# Patient Record
Sex: Female | Born: 1995 | Race: Black or African American | Hispanic: No | Marital: Single | Smoking: Never smoker
Health system: Southern US, Community
[De-identification: ages and names within clinical notes are randomized; demographics above are authoritative.]

## PROBLEM LIST (undated history)

## (undated) DIAGNOSIS — A64 Unspecified sexually transmitted disease: Secondary | ICD-10-CM

## (undated) DIAGNOSIS — N946 Dysmenorrhea, unspecified: Secondary | ICD-10-CM

## (undated) DIAGNOSIS — J302 Other seasonal allergic rhinitis: Secondary | ICD-10-CM

## (undated) DIAGNOSIS — Z889 Allergy status to unspecified drugs, medicaments and biological substances status: Secondary | ICD-10-CM

## (undated) HISTORY — DX: Dysmenorrhea, unspecified: N94.6

## (undated) HISTORY — DX: Unspecified sexually transmitted disease: A64

---

## 2016-12-23 ENCOUNTER — Emergency Department (HOSPITAL_BASED_OUTPATIENT_CLINIC_OR_DEPARTMENT_OTHER): Payer: Managed Care, Other (non HMO)

## 2016-12-23 ENCOUNTER — Observation Stay (HOSPITAL_BASED_OUTPATIENT_CLINIC_OR_DEPARTMENT_OTHER)
Admission: EM | Admit: 2016-12-23 | Discharge: 2016-12-24 | Disposition: A | Discharge status: Home / Self Care | Payer: Managed Care, Other (non HMO) | Attending: Internal Medicine | Admitting: Internal Medicine

## 2016-12-23 ENCOUNTER — Encounter (HOSPITAL_BASED_OUTPATIENT_CLINIC_OR_DEPARTMENT_OTHER): Payer: Self-pay

## 2016-12-23 DIAGNOSIS — R531 Weakness: Secondary | ICD-10-CM | POA: Insufficient documentation

## 2016-12-23 DIAGNOSIS — R29810 Facial weakness: Secondary | ICD-10-CM | POA: Insufficient documentation

## 2016-12-23 DIAGNOSIS — Z823 Family history of stroke: Secondary | ICD-10-CM | POA: Insufficient documentation

## 2016-12-23 DIAGNOSIS — R51 Headache: Secondary | ICD-10-CM

## 2016-12-23 DIAGNOSIS — R519 Headache, unspecified: Secondary | ICD-10-CM | POA: Diagnosis present

## 2016-12-23 DIAGNOSIS — R42 Dizziness and giddiness: Secondary | ICD-10-CM | POA: Insufficient documentation

## 2016-12-23 DIAGNOSIS — Z6837 Body mass index (BMI) 37.0-37.9, adult: Secondary | ICD-10-CM | POA: Insufficient documentation

## 2016-12-23 DIAGNOSIS — G8194 Hemiplegia, unspecified affecting left nondominant side: Secondary | ICD-10-CM | POA: Insufficient documentation

## 2016-12-23 DIAGNOSIS — G43109 Migraine with aura, not intractable, without status migrainosus: Secondary | ICD-10-CM | POA: Insufficient documentation

## 2016-12-23 DIAGNOSIS — R4182 Altered mental status, unspecified: Secondary | ICD-10-CM

## 2016-12-23 DIAGNOSIS — E668 Other obesity: Secondary | ICD-10-CM | POA: Insufficient documentation

## 2016-12-23 DIAGNOSIS — Z820 Family history of epilepsy and other diseases of the nervous system: Secondary | ICD-10-CM | POA: Insufficient documentation

## 2016-12-23 DIAGNOSIS — R2990 Unspecified symptoms and signs involving the nervous system: Secondary | ICD-10-CM | POA: Diagnosis present

## 2016-12-23 DIAGNOSIS — Z833 Family history of diabetes mellitus: Secondary | ICD-10-CM | POA: Insufficient documentation

## 2016-12-23 DIAGNOSIS — Z8249 Family history of ischemic heart disease and other diseases of the circulatory system: Secondary | ICD-10-CM | POA: Insufficient documentation

## 2016-12-23 HISTORY — DX: Allergy status to unspecified drugs, medicaments and biological substances: Z88.9

## 2016-12-23 LAB — DRUG SCREEN,URINE - BMC/JMC ONLY
AMPHETAMINES URINE: NEGATIVE
BARBITURATES URINE: NEGATIVE
BENZODIAZEPINES URINE: NEGATIVE
CANNABINOIDS URINE: NEGATIVE
COCAINE METABOLITES URINE: NEGATIVE
METHADONE URINE: NEGATIVE
OPIATES URINE: NEGATIVE
OXYCODONE URINE: NEGATIVE
PCP URINE: NEGATIVE
PCP URINE: NEGATIVE
TRICYCLIC ANTIDEPRESSANTS URINE: NEGATIVE

## 2016-12-23 LAB — LIPASE: LIPASE: 14 U/L — ABNORMAL LOW (ref 22–51)

## 2016-12-23 LAB — COMPREHENSIVE METABOLIC PROFILE - BMC/JMC ONLY
ALBUMIN/GLOBULIN RATIO: 1 (ref 0.8–2.0)
ALBUMIN: 3.7 g/dL (ref 3.5–5.0)
ALKALINE PHOSPHATASE: 59 U/L (ref 38–126)
ALT (SGPT): 37 U/L (ref 14–54)
ANION GAP: 9 mmol/L (ref 3–11)
AST (SGOT): 35 U/L (ref 15–41)
BILIRUBIN TOTAL: 0.7 mg/dL (ref 0.3–1.2)
BUN/CREA RATIO: 6 (ref 6–22)
BUN: 5 mg/dL — ABNORMAL LOW (ref 6–20)
CALCIUM: 9.5 mg/dL (ref 8.6–10.3)
CALCIUM: 9.5 mg/dL (ref 8.6–10.3)
CHLORIDE: 99 mmol/L — ABNORMAL LOW (ref 101–111)
CO2 TOTAL: 29 mmol/L (ref 22–32)
CREATININE: 0.77 mg/dL (ref 0.44–1.00)
ESTIMATED GFR: >60 mL/min/1.73mˆ2 (ref >60)
GLUCOSE: 94 mg/dL (ref 70–110)
POTASSIUM: 3.9 mmol/L (ref 3.4–5.1)
PROTEIN TOTAL: 7.3 g/dL (ref 6.4–8.3)
SODIUM: 137 mmol/L (ref 136–145)

## 2016-12-23 LAB — URINALYSIS WITH MICROSCOPIC REFLEX IF INDICATED BMC/JMC ONLY
BILIRUBIN: NEGATIVE mg/dL
BLOOD: NEGATIVE mg/dL
GLUCOSE: NEGATIVE mg/dL
KETONES: NEGATIVE mg/dL
LEUKOCYTES: NEGATIVE WBCs/uL
NITRITE: NEGATIVE
PH: 6.5 (ref <8.0)
PROTEIN: NEGATIVE mg/dL
SPECIFIC GRAVITY: 1.01 (ref <1.022)
UROBILINOGEN: 0.2 mg/dL (ref <=2.0)

## 2016-12-23 LAB — CBC WITH DIFF
BASOPHIL #: 0 x10ˆ3/uL (ref 0.00–0.10)
BASOPHIL %: 1 % (ref 0–3)
EOSINOPHIL #: 0.3 x10ˆ3/uL (ref 0.00–0.50)
EOSINOPHIL %: 4 % (ref 0–5)
HCT: 43 % (ref 36.0–45.0)
HGB: 13.9 g/dL (ref 12.0–15.5)
LYMPHOCYTE #: 2.2 x10ˆ3/uL (ref 1.00–4.80)
LYMPHOCYTE %: 31 % (ref 15–43)
MCH: 27.8 pg (ref 27.5–33.2)
MCHC: 32.4 g/dL (ref 32.0–36.0)
MCV: 85.9 fL (ref 82.0–97.0)
MONOCYTE #: 1.2 x10ˆ3/uL — ABNORMAL HIGH (ref 0.20–0.90)
MONOCYTE %: 17 % — ABNORMAL HIGH (ref 5–12)
MPV: 7.3 fL — ABNORMAL LOW (ref 7.4–10.5)
NEUTROPHIL #: 3.3 x10ˆ3/uL (ref 1.50–6.50)
NEUTROPHIL %: 48 % (ref 43–76)
PLATELETS: 338 x10ˆ3/uL (ref 150–450)
RBC: 5.01 x10ˆ6/uL (ref 4.00–5.10)
RDW: 14.1 % (ref 11.0–16.0)
WBC: 7 x10ˆ3/uL (ref 4.0–11.0)

## 2016-12-23 LAB — BODY FLUID CELL COUNT
NUCLEATED CELLS, FLUID: 1 /uL (ref 0–5)
RBC COUNT: 6 /uL (ref 0–10)

## 2016-12-23 LAB — GLUCOSE CSF: GLUCOSE CSF: 64 mg/dL (ref 40–70)

## 2016-12-23 LAB — HCG, PLASMA OR SERUM QUANTITATIVE, PREGNANCY: HCG QUANTITATIVE PREGNANCY: <5 IU/L (ref <5)

## 2016-12-23 LAB — PT/INR
INR: 1.08
PROTHROMBIN TIME: 11.9 s (ref 9.4–12.5)

## 2016-12-23 LAB — PTT (PARTIAL THROMBOPLASTIN TIME): APTT: 30.8 s (ref 25.1–36.5)

## 2016-12-23 LAB — TROPONIN-I: TROPONIN I: <0.03 ng/mL (ref <=0.06)

## 2016-12-23 LAB — PROTEIN CSF: PROTEIN CSF: 17 mg/dL (ref 15–45)

## 2016-12-23 LAB — LACTIC ACID LEVEL: LACTIC ACID: 0.6 mmol/L (ref 0.5–2.0)

## 2016-12-23 MED ORDER — SODIUM CHLORIDE 0.9 % IV BOLUS
1000.00 mL | INJECTION | Status: AC
Start: 2016-12-23 — End: 2016-12-23
  Administered 2016-12-23: 1000 mL via INTRAVENOUS

## 2016-12-23 MED ORDER — LIDOCAINE HCL 20 MG/ML (2 %) INJECTION SOLUTION
INTRAMUSCULAR | Status: DC
Start: 2016-12-23 — End: 2016-12-24
  Filled 2016-12-23: qty 10

## 2016-12-23 MED ORDER — MECLIZINE 25 MG TABLET
25.00 mg | ORAL_TABLET | ORAL | Status: AC
Start: 2016-12-23 — End: 2016-12-23
  Filled 2016-12-23: qty 1

## 2016-12-23 MED ORDER — ACETAMINOPHEN 500 MG TABLET
1000.00 mg | ORAL_TABLET | ORAL | Status: AC
Start: 2016-12-23 — End: 2016-12-23
  Administered 2016-12-23: 23:00:00 1000 mg via ORAL
  Filled 2016-12-23: qty 2

## 2016-12-23 MED ADMIN — meclizine 25 mg tablet: ORAL | @ 23:00:00

## 2016-12-23 NOTE — H&P (Signed)
[facility]  General Medicine  Admission H&P    Date of Service:  12/23/2016  [name],[name], 21 y.o. female  Encounter Start Date:  12/23/2016  Inpatient Admission Date:   Date of Birth:  April 04, 1996  PCP: No Pcp    Information Obtained from: patient and mother  Chief Complaint:  Headache and left sided weakness    HPI: [name] is a 21 y.o., Black/African American female who presents on referral from an urgent care center where she presented this evening with headache.  She was noted at the urgent care center to have a left facial droop and some weakness of her left extremities and was referred to emergency department for further evaluation.  Patient states she has had this headache since earlier today.  She denies slurred speech or altered mental status.  Although family who were present with her in the emergency department stated that she was acting differently, with slowing of her mentation.  She has been alert and oriented x3.  She was noted in the emergency department to have a mild left facial droop and weakness of her left upper and lower extremities.  Patient is a nonsmoker, nondrinker and denies any history of illicit drug usage.  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  She denies trauma, chest pain, shortness of breath, difficulty swallowing, visual disturbance, incontinence, abdominal pain, vomiting.  She denies paresthesias.  She does admit to some nausea occurring with the headache today.  She has no history of chronic headaches or migraines.  She also relates a recent change in her birth control method, she was previously on Depo injection and was converted to OCP 4 days ago.  She denies any over-the-counter medications, other prescribed medications, supplements.  ED evaluation included normal laboratory studies and an MRI which was negative for acute pathology.  She did undergo a lumbar puncture which was essentially  unremarkable.  Her left-sided weakness has improved but does persist and she will be admitted for further evaluation and treatment of stroke-like symptoms.      PAST MEDICAL:    History reviewed. No pertinent past medical history.  Past Medical History was reviewed and is negative for Migraines, Heart disease, lung disease, DM, HTN, HLD, Head injury  History reviewed. No past surgical history pertinent negatives.    Medications Prior to Admission     None      No Known Allergies  Vaccinations:        Pneumovax: Does not meet criteria to receive.    Influenza: Not current vaccination season.    Family History   Migraines in her mother , hypertension, stroke, CAD and diabetes    Social History  Social History     Social History   . Marital status: Single     Spouse name: N/A   . Number of children: N/A   . Years of education: N/A     Occupational History   . Not on file.     Social History Main Topics   . Smoking status: Never Smoker   . Smokeless tobacco: Never Used   . Alcohol use Yes      Comment: rarely   . Drug use: No   . Sexual activity: Not on file     Other Topics Concern   . Not on file     Social History Narrative   . No narrative on file       ROS:  Other than ROS in the HPI, all other systems were negative.    Examination:  Temperature: 37.4 C (99.3 F)  Heart Rate: 78  BP (Non-Invasive): 123/63  Respiratory Rate: 19  SpO2-1: 99 %  Pain Score (Numeric, Faces): 10  General: appears in good health, moderately obese, appears stated age, mild distress and vital signs reviewed and discussed with patient  Eyes: Conjunctiva clear., Pupils equal and round, reactive to light and accomodation. , Sclera non-icteric.   HENT:ENMT without erythema or injection, mucous membranes moist.  Neck: no thyromegaly or lymphadenopathy  Lungs: Clear to auscultation bilaterally.   Cardiovascular: regular rate and rhythm, S1, S2 normal, no murmur, click, rub or gallop  Abdomen: Soft, non-tender, Bowel sounds normal, No  hepatosplenomegaly, No masses, No hernias  Genito-urinary: Deferred  Extremities: No cyanosis or edema, No synovitis or joint effusions  Skin: Skin warm and dry, No rashes and No lesions  Neurologic: CN II - XII grossly intact , Alert and oriented x3, No tremor, Flattening of left nasolabial fold, LUE strength 4/5 proximal mm, grip 5/5, LLE 5/5, DTRs 2/4 thru-out.  Mental status, speech and cerebellar functions intact  Lymphatics: No lymphadenopathy  Psychiatric: Normal affect, behavior, memory, thought content, judgement, and speech.    Labs:    I have reviewed all lab results.  Lab Results for Last 24 Hours:    Results for orders placed or performed during the hospital encounter of 12/23/16 (from the past 24 hour(s))   Lactic Acid   Result Value Ref Range    LACTIC ACID 0.6 0.5 - 2.0 mmol/L   PTT (PARTIAL THROMBOPLASTIN TIME)   Result Value Ref Range    APTT 30.8 25.1 - 36.5 seconds   PT/INR   Result Value Ref Range    PROTHROMBIN TIME 11.9 9.4 - 12.5 seconds    INR 1.08    CBC WITH DIFF   Result Value Ref Range    WBC 7.0 4.0 - 11.0 x10^3/uL    RBC 5.01 4.00 - 5.10 x10^6/uL    HGB 13.9 12.0 - 15.5 g/dL    HCT 16.1 09.6 - 04.5 %    MCV 85.9 82.0 - 97.0 fL    MCH 27.8 27.5 - 33.2 pg    MCHC 32.4 32.0 - 36.0 g/dL    RDW 40.9 81.1 - 91.4 %    PLATELETS 338 150 - 450 x10^3/uL    MPV 7.3 (L) 7.4 - 10.5 fL    NEUTROPHIL % 48 43 - 76 %    LYMPHOCYTE % 31 15 - 43 %    MONOCYTE % 17 (H) 5 - 12 %    EOSINOPHIL % 4 0 - 5 %    BASOPHIL % 1 0 - 3 %    NEUTROPHIL # 3.30 1.50 - 6.50 x10^3/uL    LYMPHOCYTE # 2.20 1.00 - 4.80 x10^3/uL    MONOCYTE # 1.20 (H) 0.20 - 0.90 x10^3/uL    EOSINOPHIL # 0.30 0.00 - 0.50 x10^3/uL    BASOPHIL # 0.00 0.00 - 0.10 x10^3/uL   COMPREHENSIVE METABOLIC PROFILE - [facility] ONLY   Result Value Ref Range    SODIUM 137 136 - 145 mmol/L    POTASSIUM 3.9 3.4 - 5.1 mmol/L    CHLORIDE 99 (L) 101 - 111 mmol/L    CO2 TOTAL 29 22 - 32 mmol/L    ANION GAP 9 3 - 11 mmol/L    BUN 5 (L) 6 - 20 mg/dL    CREATININE  7.82  0.44 - 1.00 mg/dL    BUN/CREA RATIO 6 6 - 22    ESTIMATED GFR >60 >60 mL/min/1.15m^2    ALBUMIN 3.7 3.5 - 5.0 g/dL    CALCIUM 9.5 8.6 - 91.4 mg/dL    GLUCOSE 94 70 - 782 mg/dL    ALKALINE PHOSPHATASE 59 38 - 126 U/L    ALT (SGPT) 37 14 - 54 U/L    AST (SGOT) 35 15 - 41 U/L    BILIRUBIN TOTAL 0.7 0.3 - 1.2 mg/dL    PROTEIN TOTAL 7.3 6.4 - 8.3 g/dL    ALBUMIN/GLOBULIN RATIO 1.0 0.8 - 2.0   HCG, PLASMA OR SERUM QUANTITATIVE, PREGNANCY   Result Value Ref Range    HCG QUANTITATIVE PREGNANCY <5 <5 IU/L   Lipase   Result Value Ref Range    LIPASE 14 (L) 22 - 51 U/L   URINALYSIS WITH MICROSCOPIC REFLEX IF INDICATED [facility] ONLY   Result Value Ref Range    COLOR Light Yellow Light Yellow, Straw, Yellow    APPEARANCE Clear Clear    PH 6.5 <8.0    LEUKOCYTES Negative Negative WBCs/uL    NITRITE Negative Negative    PROTEIN Negative Negative mg/dL    GLUCOSE Negative Negative mg/dL    KETONES Negative Negative mg/dL    UROBILINOGEN 0.2  <=9.5 mg/dL    BILIRUBIN Negative Negative mg/dL    BLOOD Negative Negative mg/dL    SPECIFIC GRAVITY 6.213 <1.022   DRUG SCREEN,URINE - [facility] ONLY   Result Value Ref Range    AMPHETAMINES URINE Negative Negative    BARBITURATES URINE Negative Negative    BENZODIAZEPINES URINE Negative Negative    PCP URINE Negative Negative    METHADONE URINE Negative Negative    OPIATES URINE Negative Negative    CANNABINOIDS URINE Negative Negative    COCAINE METABOLITES URINE Negative Negative    OXYCODONE URINE Negative Negative    TRICYCLIC ANTIDEPRESSANTS URINE Negative Negative   CSF CULTURE WITH GRAM STAIN   Result Value Ref Range    GRAM STAIN 1+ Rare WBCs     GRAM STAIN No Organisms Seen    Glucose, CSF   Result Value Ref Range    GLUCOSE CSF 64 40 - 70 mg/dL   Protein, CSF   Result Value Ref Range    PROTEIN CSF 17 15 - 45 mg/dL   BODY FLUID CELL COUNT - CSF   Result Value Ref Range    COLOR Colorless Colorless    CLARITY Clear Clear    NUCLEATED CELLS, FLUID 1 0 - 5 /uL    RBC COUNT 6 0 - 10  /uL    SPECIMEN SITE Tube 4        Imaging Studies:    EXAMINATION: MRI BRAIN WO IV CONTRAST     EXAM DATE/TIME:  12/23/2016 7:37 PM    CLINICAL INDICATION: L sided defecit, HA, dizziness    COMPARISON: None.    FINDINGS:  Extra axial spaces: Normal in size and morphology for the patient's age.  Intracranial hemorrhage: None.  Ventricular system: Normal in size and morphology for the patient's age.  Basal cisterns: Normal.  Cerebral parenchyma: Normal..  Midline shift: None.  Cerebellum: Normal.  Brainstem: Normal.  Calvarium: Normal..  Vascular system: Normal flow voids, without atheroma, occlusion, or  dissection.  Paranasal sinuses and mastoid air cells: Clear.  Visualized Orbits: Normal.  Visualized upper cervical spine: Normal.  Sella: Normal.  Skull base: Normal.  Marrow: Normal.  Impression    Unremarkable MRI of the brain     ECG:  Pending    DNR Status:  Full Code    Assessment/Plan:   Active Hospital Problems   (*Primary Problem)    Diagnosis   . *Stroke-like symptoms   . Headache   . Dizziness       Stroke-like symptoms  --concern for TIA versus hemiplegic migraine versus CNS disease such as MS  --left hemiplegia and facial droop  --these have improved significantly after arrival to the floor but she has some persistent mild flattening of her left nasolabial fold and mild proximal left upper extremity weakness.  --MRI negative  --LP negative, MS banding ordered on LP  --serial neuro exams  --neurology consultation in a.m.  --echocardiogram in a.m.  --consider CT angiogram and further CNS imaging    DVT/PE Prophylaxis: Not indicated, low risk for VTE    [name] [name] Jerry, DO

## 2016-12-23 NOTE — ED Nurses Note (Signed)
Called report to ortho.

## 2016-12-23 NOTE — ED Triage Notes (Signed)
Pt was sent via EMS from [facility] for HA and dizziness.  Per EMS GCS 4-4-6, as she is very slow to respond to questions and at times confused about her answers.

## 2016-12-23 NOTE — ED Provider Notes (Signed)
Bronwen Betters, PA-C  [facility]  [address]  [place], [place] [address]    Emergency Department Visit Note      Date: 12/23/2016  Primary care provider: No Pcp  Means of arrival: ambulance  History obtained by: patient and parent  History limited by: none    Chief Complaint:  Headache    History of Present Illness     [name], date of birth April 26, 1996, is a 21 y.o. female who presents to the Emergency Department complaining of headache. Patient reports HA x 9 hours. Patient reports the front of her head slowly started to hurt during her first class and has slowly gotten worse. Pt reports a few hours PTA she started to feel dizzy and went to urgent care and was sent here. Patient took tylenol for her HA but it did not help. Patient denies trauma to her head. She denies hx of HAs. Denies vomiting. Denies fever or recent illness. Patient denies medical problems and reports the only medication she takes is a new birth control that she started two days ago (unsure of name but confirms it is oral, she was previously on depo). Denies numbness, tingling, surgeries, abdominal pain. Denies history of migraines.     Mom is at bedside and confirms this is not normal for patient. Mom reports patient is normally very quick to answer questions. She denies etoh or hx of drug use and confirms patient started taking OCPs two days ago. Today upon waking, mom reports patient was her normal self but just reported to her that she "didn't feel well" but was still able to go to school.    Review of Systems     The pertinent positive and negative symptoms are as per HPI. All other systems reviewed and are negative.    Patient History      Past Medical History:  History reviewed. No pertinent past medical history.    Past Surgical History:  History reviewed. No pertinent surgical history.    Family History:  Family Medical History     None              Social History:  Social History   Substance Use Topics   . Smoking  status: Never Smoker   . Smokeless tobacco: Never Used   . Alcohol use Yes      Comment: rarely     History   Drug Use No       Medications:  Patient's Medications    No medications on file       Allergies:   No Known Allergies    Physical Exam     Vital Signs:    Filed Vitals:    12/23/16 1809 12/23/16 2045 12/23/16 2230   BP: (!) 142/88 117/80 123/63   Pulse: 81 80 78   Resp: Temp: 37.4 C (99.3 F)     SpO2: 99% 96% 99%       Pulse Ox: 99% on None (Room Air); interpreted by me [id number]    Constitutional: This is an awake and WDWN female patient who is showing no outward signs of distress.    Neuro/Psychiatric: Patient answers questions appropriately but slowly, patient aware of person, place, president but is unaware of date (believes it is March, does not remember her birthday last month), patient is overall appropriate. Weakness to entire L side (LUE/LLE), decreased grip strength on L, no obvious facial droop but with attempted smile, L face does  not elevate, otherwise CN II-XII grossly intact. Slight pronator drift on L, + truncal ataxia  Skin: Warm, dry and good color. No obvious rash noted.  HEENT:   Head: Normocephalic and atraumatic.   Mouth/Throat: Oropharynx is clear and moist.   Ears: EACs show no erythema or exudates and TMs are intact bilaterally with no exudates   Eyes: unable to track finger movements without head movement, eye movements not steady, PERRL, conjunctiva normal in appearance, no gross deformity noted  Neck: Trachea midline. Neck supple. No adenopathy  Cardiovascular: RRR, no obvious murmurs, rubs or gallops appreciated. Intact distal pulses.    Pulmonary/Chest: No respiratory distress. BS clear and equal to auscultation bilaterally.   Abdominal: BS +. Abdomen soft, no tenderness, rebound or guarding. No obvious masses or organomegaly.    Musculoskeletal: No obvious edema or deformity.  Moves spine and extremities freely.     Diagnostics     Labs:    Results for orders  placed or performed during the hospital encounter of 12/23/16   CSF CULTURE WITH GRAM STAIN   Result Value Ref Range    GRAM STAIN 1+ Rare WBCs     GRAM STAIN No Organisms Seen    COMPREHENSIVE METABOLIC PROFILE - [facility] ONLY   Result Value Ref Range    SODIUM 137 136 - 145 mmol/L    POTASSIUM 3.9 3.4 - 5.1 mmol/L    CHLORIDE 99 (L) 101 - 111 mmol/L    CO2 TOTAL 29 22 - 32 mmol/L    ANION GAP 9 3 - 11 mmol/L    BUN 5 (L) 6 - 20 mg/dL    CREATININE 9.60 4.54 - 1.00 mg/dL    BUN/CREA RATIO 6 6 - 22    ESTIMATED GFR >60 >60 mL/min/1.32m^2    ALBUMIN 3.7 3.5 - 5.0 g/dL    CALCIUM 9.5 8.6 - 09.8 mg/dL    GLUCOSE 94 70 - 119 mg/dL    ALKALINE PHOSPHATASE 59 38 - 126 U/L    ALT (SGPT) 37 14 - 54 U/L    AST (SGOT) 35 15 - 41 U/L    BILIRUBIN TOTAL 0.7 0.3 - 1.2 mg/dL    PROTEIN TOTAL 7.3 6.4 - 8.3 g/dL    ALBUMIN/GLOBULIN RATIO 1.0 0.8 - 2.0   URINALYSIS WITH MICROSCOPIC REFLEX IF INDICATED [facility] ONLY   Result Value Ref Range    COLOR Light Yellow Light Yellow, Straw, Yellow    APPEARANCE Clear Clear    PH 6.5 <8.0    LEUKOCYTES Negative Negative WBCs/uL    NITRITE Negative Negative    PROTEIN Negative Negative mg/dL    GLUCOSE Negative Negative mg/dL    KETONES Negative Negative mg/dL    UROBILINOGEN 0.2  <=1.4 mg/dL    BILIRUBIN Negative Negative mg/dL    BLOOD Negative Negative mg/dL    SPECIFIC GRAVITY 7.829 <1.022   HCG, PLASMA OR SERUM QUANTITATIVE, PREGNANCY   Result Value Ref Range    HCG QUANTITATIVE PREGNANCY <5 <5 IU/L   Lipase   Result Value Ref Range    LIPASE 14 (L) 22 - 51 U/L   DRUG SCREEN,URINE - [facility] ONLY   Result Value Ref Range    AMPHETAMINES URINE Negative Negative    BARBITURATES URINE Negative Negative    BENZODIAZEPINES URINE Negative Negative    PCP URINE Negative Negative    METHADONE URINE Negative Negative    OPIATES URINE Negative Negative    CANNABINOIDS URINE Negative Negative    COCAINE  METABOLITES URINE Negative Negative    OXYCODONE URINE Negative Negative    TRICYCLIC  ANTIDEPRESSANTS URINE Negative Negative   Lactic Acid   Result Value Ref Range    LACTIC ACID 0.6 0.5 - 2.0 mmol/L   PTT (PARTIAL THROMBOPLASTIN TIME)   Result Value Ref Range    APTT 30.8 25.1 - 36.5 seconds   PT/INR   Result Value Ref Range    PROTHROMBIN TIME 11.9 9.4 - 12.5 seconds    INR 1.08    CBC WITH DIFF   Result Value Ref Range    WBC 7.0 4.0 - 11.0 x10^3/uL    RBC 5.01 4.00 - 5.10 x10^6/uL    HGB 13.9 12.0 - 15.5 g/dL    HCT 40.9 81.1 - 91.4 %    MCV 85.9 82.0 - 97.0 fL    MCH 27.8 27.5 - 33.2 pg    MCHC 32.4 32.0 - 36.0 g/dL    RDW 78.2 95.6 - 21.3 %    PLATELETS 338 150 - 450 x10^3/uL    MPV 7.3 (L) 7.4 - 10.5 fL    NEUTROPHIL % 48 43 - 76 %    LYMPHOCYTE % 31 15 - 43 %    MONOCYTE % 17 (H) 5 - 12 %    EOSINOPHIL % 4 0 - 5 %    BASOPHIL % 1 0 - 3 %    NEUTROPHIL # 3.30 1.50 - 6.50 x10^3/uL    LYMPHOCYTE # 2.20 1.00 - 4.80 x10^3/uL    MONOCYTE # 1.20 (H) 0.20 - 0.90 x10^3/uL    EOSINOPHIL # 0.30 0.00 - 0.50 x10^3/uL    BASOPHIL # 0.00 0.00 - 0.10 x10^3/uL   Glucose, CSF   Result Value Ref Range    GLUCOSE CSF 64 40 - 70 mg/dL   Protein, CSF   Result Value Ref Range    PROTEIN CSF 17 15 - 45 mg/dL   BODY FLUID CELL COUNT - CSF   Result Value Ref Range    COLOR Colorless Colorless    CLARITY Clear Clear    NUCLEATED CELLS, FLUID 1 0 - 5 /uL    RBC COUNT 6 0 - 10 /uL    SPECIMEN SITE Tube 4      Labs reviewed and interpreted by me.    Radiology:    Ordered this visit:  MRI BRAIN WO IV CONTRAST     Results:  Results for orders placed or performed during the hospital encounter of 12/23/16 (from the past 72 hour(s))   MRI BRAIN WO IV CONTRAST     Status: None    Narrative    RADIOLOGIST: Janne Lab, MD    EXAMINATION: MRI BRAIN WO IV CONTRAST     EXAM DATE/TIME:  12/23/2016 7:37 PM    CLINICAL INDICATION: L sided defecit, HA, dizziness    COMPARISON: None.    FINDINGS:  Extra axial spaces: Normal in size and morphology for the patient's age.  Intracranial hemorrhage: None.  Ventricular system: Normal in size  and morphology for the patient's age.  Basal cisterns: Normal.  Cerebral parenchyma: Normal..  Midline shift: None.  Cerebellum: Normal.  Brainstem: Normal.  Calvarium: Normal..  Vascular system: Normal flow voids, without atheroma, occlusion, or  dissection.  Paranasal sinuses and mastoid air cells: Clear.  Visualized Orbits: Normal.  Visualized upper cervical spine: Normal.  Sella: Normal.  Skull base: Normal.  Marrow: Normal.      Impression    Unremarkable MRI of the  brain         Interpreted by radiologist and independently reviewed by me.    EKG interpretation:  12-Lead EKG reveals, per Dr. Clinton Sawyer-  sinus rhythm, rate of 70, normal axis, regular intervals, no ST segment elevation noted.    ED Progress Note/Medical Decision Making     Old records reviewed by me:   ED Visits- no visits to review  I have reviewed the patient's recent past medical history. Nurse's notes reviewed.  Orders placed during this encounter:  Orders Placed This Encounter   . CSF CULTURE WITH GRAM STAIN   . MRI BRAIN WO IV CONTRAST   . XR CHEST AP PORTABLE   . CBC/DIFF   . COMPREHENSIVE METABOLIC PROFILE - [facility] ONLY   . URINALYSIS WITH MICROSCOPIC REFLEX IF INDICATED [facility] ONLY   . HCG, PLASMA OR SERUM QUANTITATIVE, PREGNANCY   . Lipase   . DRUG SCREEN,URINE - [facility] ONLY   . Lactic Acid   . PTT (PARTIAL THROMBOPLASTIN TIME)   . PT/INR   . CBC WITH DIFF   . Glucose, CSF   . Protein, CSF   . MULTIPLE SCLEROSIS (MS) PROFILE WITH OLIGOCLONAL BANDING   . BODY FLUID CELL COUNT - CSF   . TROPONIN-I   . ECG 12-LEAD   . ECG 12-LEAD   . lidocaine 20 mg/mL (2 %) injection ---Cabinet Override   . NS bolus infusion 1,000 mL   . acetaminophen (TYLENOL) tablet   . meclizine (ANTIVERT) tablet       Patient was initially evaluated by me, possible etiologies for symptoms were discussed with patient and mother the need for further evaluation with the above labs as well as CT scan of head , patient and mother verbalized understanding and was in  agreement with the plan at this time. Discussed case with Dr. Clinton Sawyer, will change CT to MRI- pt and mom in agreement with the plan.     Negative MRI discussed with patient and mother, patient has improved speech with normal pace and mother believes she is improved as well, discussed with them proceeding with lumbar puncture- they are agreeable, consent signed by patient, Dr. Clinton Sawyer at bedside for LP    Lab results discussed with patient and that she would be gotten up to ambulate and I would also be speaking with neurology, she verbalized understanding and had no further questions at this time. She is requesting something for her HA, will order dose of Meclizine, tylenol and NS bolus.     Neurology paged    Discussed case with Susa Loffler, NP who advised patient can be discharged to follow up in the office, no other recommendations received at this time.     Repeat exam shows improvement, patient's speech continues to improve, patient attempted to ambulate but reported double vision and nearly fell, she was attempted to ambulate again and was unable to do so, discussed with Dr. Clinton Sawyer, will phone hospitalist    Dr. Clinton Sawyer spoke with hospitalist, Dr. Bing Quarry, who will admit patient for further management, patient and mother agreeable to plan at this time    Supervising physician: Dr. Thelma Barge Vitals:    12/23/16 1809 12/23/16 2045 12/23/16 2230   BP: (!) 142/88 117/80 123/63   Pulse: 81 80 78   Resp: 20 16 19    Temp: 37.4 C (99.3 F)     SpO2: 99% 96% 99%         Clinical Impression  Encounter Diagnoses   Name Primary?   . 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 Stroke-like symptoms    . Headache    . Dizziness    . Altered mental status, unspecified altered mental status type        Plan/Disposition     Admitted     Condition on Disposition: Stable

## 2016-12-23 NOTE — ED Attending Handoff Note (Signed)
Patient was personally evaluated and examined by myself. I was directive of the care provided to the patient and I agree with the midlevel provider's findings, including the diagnostic interpretations and treatment plans that are documented. I was present for key portions of separately performed procedures if they were necessary. I was also present for time dedicated to interactions inclusive of time noted in any critical care situations. Please see PA's note for details.

## 2016-12-23 NOTE — ED Nurses Note (Signed)
Patient's speech has improved and she is now fluent in her speech and is able to converse appropriately.

## 2016-12-24 ENCOUNTER — Encounter (HOSPITAL_BASED_OUTPATIENT_CLINIC_OR_DEPARTMENT_OTHER): Payer: Self-pay

## 2016-12-24 ENCOUNTER — Observation Stay (HOSPITAL_BASED_OUTPATIENT_CLINIC_OR_DEPARTMENT_OTHER): Payer: Managed Care, Other (non HMO)

## 2016-12-24 DIAGNOSIS — G43909 Migraine, unspecified, not intractable, without status migrainosus: Secondary | ICD-10-CM

## 2016-12-24 DIAGNOSIS — Z820 Family history of epilepsy and other diseases of the nervous system: Secondary | ICD-10-CM

## 2016-12-24 LAB — THYROID STIMULATING HORMONE (SENSITIVE TSH): TSH: 1.19 u[IU]/mL (ref 0.340–5.330)

## 2016-12-24 LAB — LIPID PANEL
CHOL/HDL RATIO: 2.4
CHOLESTEROL: 142 mg/dL (ref 120–199)
HDL CHOL: 60 mg/dL (ref 45–65)
LDL CALC: 68 mg/dL (ref <=130)
TRIGLYCERIDES: 71 mg/dL (ref 35–135)
VLDL CALC: 14 mg/dL (ref 5–35)

## 2016-12-24 LAB — C-REACTIVE PROTEIN(CRP),INFLAMMATION: CRP INFLAMMATION: 13.9 mg/L — ABNORMAL HIGH (ref <=10.0)

## 2016-12-24 LAB — VITAMIN B12: VITAMIN B 12: 294 pg/mL (ref 180–914)

## 2016-12-24 LAB — HGA1C (HEMOGLOBIN A1C WITH EST AVG GLUCOSE)
ESTIMATED AVERAGE GLUCOSE: 111 mg/dL — ABNORMAL HIGH (ref 70–110)
GLYCOHEMOGLOBIN (HBA1C): 5.5 % (ref 4.0–6.0)

## 2016-12-24 LAB — SYPHILIS SCREENING ALGORITHM WITH REFLEX, SERUM: SYPHILIS TP ANTIBODIES QUALITATIVE: NONREACTIVE

## 2016-12-24 LAB — SEDIMENTATION RATE: ERYTHROCYTE SEDIMENTATION RATE (ESR): 19 mm/h (ref 0–20)

## 2016-12-24 LAB — FOLATE: FOLATE: 16.2 ng/mL (ref >=4.5)

## 2016-12-24 MED ORDER — SODIUM CHLORIDE 0.9 % (FLUSH) INJECTION SYRINGE
10.0000 mL | INJECTION | INTRAMUSCULAR | Status: DC | PRN
Start: 2016-12-24 — End: 2016-12-24

## 2016-12-24 MED ORDER — POTASSIUM CHLORIDE 20 MEQ/L IN 0.9 % SODIUM CHLORIDE INTRAVENOUS
INTRAVENOUS | Status: DC
Start: 2016-12-24 — End: 2016-12-24
  Filled 2016-12-24: qty 1000

## 2016-12-24 MED ORDER — ASPIRIN 325 MG TABLET,DELAYED RELEASE
325.0000 mg | DELAYED_RELEASE_TABLET | Freq: Every day | ORAL | Status: DC
Start: 2016-12-24 — End: 2016-12-24
  Administered 2016-12-24: 325 mg via ORAL
  Filled 2016-12-24: qty 1

## 2016-12-24 MED ORDER — SODIUM CHLORIDE 0.9 % (FLUSH) INJECTION SYRINGE
10.0000 mL | INJECTION | Freq: Three times a day (TID) | INTRAMUSCULAR | Status: DC
Start: 2016-12-24 — End: 2016-12-24
  Administered 2016-12-24: 10 mL via INTRAVENOUS

## 2016-12-24 MED ADMIN — potassium chloride 20 mEq/L in 0.9 % sodium chloride intravenous: INTRAVENOUS | @ 01:00:00

## 2016-12-24 MED ADMIN — aspirin 325 mg tablet,delayed release: ORAL | @ 09:00:00

## 2016-12-24 MED ADMIN — sodium chloride 0.9 % (flush) injection syringe: INTRAVENOUS | @ 01:00:00

## 2016-12-24 NOTE — Care Plan (Signed)
Problem: Patient Care Overview (Adult,OB)  Goal: Plan of Care Review(Adult,OB)  The patient and/or their representative will communicate an understanding of their plan of care   Outcome: Ongoing (see interventions/notes)      Problem: Stroke (Ischemic) (Adult)  Prevent and manage potential problems including:  1. acute neurologic deterioration  2. bladder/bowel dysfunction  3. cognitive impairment  4. communication impairment  5. eating/swallowing impairment  6. fever  7. hemodynamic instability  8. motor/sensory impairment  9. muscle tone abnormal  10. respiratory compromise  11. situational response  12. skin breakdown  13. venous thromboembolism   Goal: Signs and Symptoms of Listed Potential Problems Will be Absent, Minimized or Managed (Stroke)  Signs and symptoms of listed potential problems will be absent, minimized or managed by discharge/transition of care (reference Stroke (Ischemic) (Adult) CPG).   Outcome: Ongoing (see interventions/notes)      Problem: Fall Risk (Adult)  Goal: Absence of Falls  Patient will demonstrate the desired outcomes by discharge/transition of care.   Outcome: Ongoing (see interventions/notes)      Comments: Care Plan Note     Patient here for stroke like symptoms but is dx with migraine. Neuro checks show left side moderate and right side strong. Memory/vision/swallowing-no issues. High fall risk-little wobbly when up. Denies any pain at this time. Appetite fair. Patient works at [employer] and goes to school.Voiding well. Last BM was 4/10. Seen by L.Journalist, newspaper.

## 2016-12-24 NOTE — Nurses Notes (Signed)
Pt admitted to room 434A via stretcher from ED. Pt ambulated to bed with 2 assist. Unit orientation packet, stroke pathway, and individual stroke education provided and discussed with patient and patient's mother. Tele # [phone] placed on pt. No questions at this time. Call bell within reach.

## 2016-12-24 NOTE — Nurses Notes (Signed)
Pt states her PCP is Dr. Norma Fredrickson at [facility].

## 2016-12-24 NOTE — Nurses Notes (Signed)
Report received from E.Carpenter RN.

## 2016-12-24 NOTE — Consults (Signed)
[facility] MEDICINE  [facility]  [place], [place] [address]    Neurology Initial Consult      [name] is a 21 y.o. female  Date of Admission: 12/23/2016    Date of service: 12/24/16  Date of Birth:  31-May-1996    Consult Requested By: Dr. Bing Quarry    Information obtained from: patient, health care provider and history reviewed via medical record  Chief Complaint:  Headache, L side weakness    [name] is a 21 y.o. female, who presents with hx of migraine as a teen; never treated other then OTC; mother has migraine headaches; she did have MHA and photophobia, phonophobia as a teen but never had any problems with it for many years; she presented with L side headache yesterday, and given the L side weakness, she was kept for obs and stroke w/u; she has no RF of stroke and her MRI Brain was normal; this morning she is feeling better, very mild headache; she is a Conservation officer, nature at night; sleeps during the day (?nightshift sleep d/o) and is otherwise healthy; she just started BCP 3d ago for the first time; she does have seasonal allergies (which may also be a trigger)    Past Medical History:   Diagnosis Date   . Hx of seasonal allergies            Past Surgical History was reviewed and is negative for surgery        No Known Allergies    (Not in an outpatient encounter)  Current Facility-Administered Medications   Medication Dose Route Frequency Provider Last Rate Last Dose   . aspirin (ECOTRIN) enteric coated tablet 325 mg  325 mg Oral Daily Debord, Karolee Ohs, DO   325 mg at 12/24/16 0843   . lidocaine 20 mg/mL (2 %) injection ---[employer]            . NS 1000 mL with potassium chloride 20 mEq premix infusion   Intravenous Continuous Debord, Karolee Ohs, DO 75 mL/hr at 12/24/16 0041     . NS flush syringe  10 mL Intravenous Q8HRS Debord, Karolee Ohs, DO   Stopped at 12/24/16 0600   . NS flush syringe  10 mL Intravenous Q1H PRN Debord, Karolee Ohs, DO           Social History     Social History    . Marital status: Single     Spouse name: N/A   . Number of children: N/A   . Years of education: N/A     Occupational History   . Not on file.     Social History Main Topics   . Smoking status: Never Smoker   . Smokeless tobacco: Never Used   . Alcohol use Yes      Comment: rarely   . Drug use: No   . Sexual activity: Not on file     Other Topics Concern   . Not on file     Social History Narrative       Family Medical History     None              ROS: Other than ROS in the HPI, all other systems were negative.    Exam:   BP 128/68  Pulse 99  Temp 37.6 C (99.7 F)  Resp 16  Ht 1.651 m ( )  Wt 102.1 kg (225 lb)  LMP 12/07/2016  SpO2 96%  BMI 37.44 kg/m2  Telemetry: NSR  General: No apparent distress, healthy appearance.  Neck: supple, no carotid bruits.  Cardiovascular: regular rate and rhythm  Lungs: Clear to auscultation  Extremities: no obvious circulatory problems or trauma  Ophthalomscopic: -  Mental status:  Level of Consciousness: alert  Orientations: Alert and oriented x 3  Memory: Registration, Recall, and Following of commands is normal  Attention: Attention and Concentration are normal  Knowledge: Good  Language: Normal  Speech: Normal   Cranial nerves:   CN2: Visual acuity and fields intact  CN 3,4,6: EOMI, PERRLA  CN 5Facial sensation intact  CN 7Face symmetrical  CN 8: Hearing grossly intact  CN 9,10: Palate symmetric and gag normal  CN 11: Sternocleidomastoid and Trapezius have normal strength.   CN 12: Tongue normal with no fasiculations or deviation  Gait, Coordination, and Reflexes:   Gait: Normal  Coordination: Coordination is normal without tremor  Reflexes: Reflexes are 2/2 throughout  Motor:   Muscle tone:  Upper and lower muscle tone is normal  Motor strength:  Motor strength is normal throughout.  Sensory: Sensory exam in the upper and lower extremities is normal    Misc: no TMJ or ON tenderness    Labs:   Admission on 12/23/2016   Component Date Value Ref Range Status   .  SODIUM 12/23/2016 137  136 - 145 mmol/L Final   . POTASSIUM 12/23/2016 3.9  3.4 - 5.1 mmol/L Final   . CHLORIDE 12/23/2016 99* 101 - 111 mmol/L Final   . CO2 TOTAL 12/23/2016 29  22 - 32 mmol/L Final   . ANION GAP 12/23/2016 9  3 - 11 mmol/L Final   . BUN 12/23/2016 5* 6 - 20 mg/dL Final   . CREATININE 29/56/2130 0.77  0.44 - 1.00 mg/dL Final   . BUN/CREA RATIO 12/23/2016 6  6 - 22 Final   . ESTIMATED GFR 12/23/2016 >60  >60 mL/min/1.48m^2 Final   . ALBUMIN 12/23/2016 3.7  3.5 - 5.0 g/dL Final   . CALCIUM 86/57/8469 9.5  8.6 - 10.3 mg/dL Final   . GLUCOSE 62/95/2841 94  70 - 110 mg/dL Final   . ALKALINE PHOSPHATASE 12/23/2016 59  38 - 126 U/L Final    Prelim result verified   . ALT (SGPT) 12/23/2016 37  14 - 54 U/L Final    Prelim result verified   . AST (SGOT) 12/23/2016 35  15 - 41 U/L Final    Prelim result verified   . BILIRUBIN TOTAL 12/23/2016 0.7  0.3 - 1.2 mg/dL Final    Prelim result verified   . PROTEIN TOTAL 12/23/2016 7.3  6.4 - 8.3 g/dL Final   . ALBUMIN/GLOBULIN RATIO 12/23/2016 1.0  0.8 - 2.0 Final   . COLOR 12/23/2016 Light Yellow  Light Yellow, Straw, Yellow Final   . APPEARANCE 12/23/2016 Clear  Clear Final   . PH 12/23/2016 6.5  <8.0 Final   . LEUKOCYTES 12/23/2016 Negative  Negative WBCs/uL Final   . NITRITE 12/23/2016 Negative  Negative Final   . PROTEIN 12/23/2016 Negative  Negative mg/dL Final   . GLUCOSE 32/44/0102 Negative  Negative mg/dL Final   . KETONES 72/53/6644 Negative  Negative mg/dL Final   . UROBILINOGEN 12/23/2016 0.2   <=2.0 mg/dL Final   . BILIRUBIN 03/47/4259 Negative  Negative mg/dL Final   . BLOOD 56/38/7564 Negative  Negative mg/dL Final   . SPECIFIC GRAVITY 12/23/2016 1.010  <1.022 Final   . HCG QUANTITATIVE PREGNANCY 12/23/2016 <5  <5 IU/L Final   . LIPASE  12/23/2016 14* 22 - 51 U/L Final   . AMPHETAMINES URINE 12/23/2016 Negative  Negative Final      Cutoff of 1000 ng/mL   . BARBITURATES URINE 12/23/2016 Negative  Negative Final      Cutoff of 200 ng/mL   .  BENZODIAZEPINES URINE 12/23/2016 Negative  Negative Final      Cutoff of 200 ng/mL   . PCP URINE 12/23/2016 Negative  Negative Final      Cutoff of 25 ng/mL   . METHADONE URINE 12/23/2016 Negative  Negative Final      Cutoff of 300 ng/mL   . OPIATES URINE 12/23/2016 Negative  Negative Final      Cutoff of 300 ng/mL     . CANNABINOIDS URINE 12/23/2016 Negative  Negative Final      Cutoff of 50 ng/mL   . COCAINE METABOLITES URINE 12/23/2016 Negative  Negative Final      Cutoff of 300 ng/mL   . OXYCODONE URINE 12/23/2016 Negative  Negative Final      Cutoff of 300 ng/mL   . TRICYCLIC ANTIDEPRESSANTS URINE 12/23/2016 Negative  Negative Final      Cutoff of 300 ng/mL   . LACTIC ACID 12/23/2016 0.6  0.5 - 2.0 mmol/L Final   . APTT 12/23/2016 30.8  25.1 - 36.5 seconds Final   . PROTHROMBIN TIME 12/23/2016 11.9  9.4 - 12.5 seconds Final   . INR 12/23/2016 1.08   Final   . WBC 12/23/2016 7.0  4.0 - 11.0 x10^3/uL Final   . RBC 12/23/2016 5.01  4.00 - 5.10 x10^6/uL Final   . HGB 12/23/2016 13.9  12.0 - 15.5 g/dL Final   . HCT 13/04/6577 43.0  36.0 - 45.0 % Final   . MCV 12/23/2016 85.9  82.0 - 97.0 fL Final   . MCH 12/23/2016 27.8  27.5 - 33.2 pg Final   . MCHC 12/23/2016 32.4  32.0 - 36.0 g/dL Final   . RDW 46/96/2952 14.1  11.0 - 16.0 % Final   . PLATELETS 12/23/2016 338  150 - 450 x10^3/uL Final   . MPV 12/23/2016 7.3* 7.4 - 10.5 fL Final   . NEUTROPHIL % 12/23/2016 48  43 - 76 % Final   . LYMPHOCYTE % 12/23/2016 31  15 - 43 % Final   . MONOCYTE % 12/23/2016 17* 5 - 12 % Final   . EOSINOPHIL % 12/23/2016 4  0 - 5 % Final   . BASOPHIL % 12/23/2016 1  0 - 3 % Final   . NEUTROPHIL # 12/23/2016 3.30  1.50 - 6.50 x10^3/uL Final   . LYMPHOCYTE # 12/23/2016 2.20  1.00 - 4.80 x10^3/uL Final   . MONOCYTE # 12/23/2016 1.20* 0.20 - 0.90 x10^3/uL Final   . EOSINOPHIL # 12/23/2016 0.30  0.00 - 0.50 x10^3/uL Final   . BASOPHIL # 12/23/2016 0.00  0.00 - 0.10 x10^3/uL Final   . Ventricular rate 12/23/2016 70  BPM Incomplete   . Atrial Rate  12/23/2016 70  BPM Incomplete   . PR Interval 12/23/2016 142  ms Incomplete   . QRS Duration 12/23/2016 82  ms Incomplete   . QT Interval 12/23/2016 366  ms Incomplete   . QTC Calculation 12/23/2016 395  ms Incomplete   . Calculated P Axis 12/23/2016 58  degrees Incomplete   . Calculated R Axis 12/23/2016 57  degrees Incomplete   . Calculated T Axis 12/23/2016 45  degrees Incomplete   . GRAM STAIN 12/23/2016 1+ Rare WBCs  Preliminary   . GRAM STAIN 12/23/2016 No Organisms Seen   Preliminary   . GLUCOSE CSF 12/23/2016 64  40 - 70 mg/dL Final   . PROTEIN CSF 16/06/9603 17  15 - 45 mg/dL Final   . COLOR 54/05/8118 Colorless  Colorless Final   . CLARITY 12/23/2016 Clear  Clear Final   . NUCLEATED CELLS, FLUID 12/23/2016 1  0 - 5 /uL Final   . RBC COUNT 12/23/2016 6  0 - 10 /uL Final   . SPECIMEN SITE 12/23/2016 Tube 4   Final   . TROPONIN I 12/23/2016 <0.03  <=0.06 ng/mL Final    Troponin values of 0.07 - 0.50 ng/mL may represent cardiac  damage or increased risk of coronary event. Non-acute  coronary syndrome conditions, including tachycardia,  coronary vasospasm, congestive heart failure, myocarditis,  pulmonary embolus, sepsis, and cardiac surgery could result  in myocardial damage and increased troponin levels. Use  troponin in conjunction with history, appropriate symptoms,  and/or ECG changes. Serial measurements may be needed to  assess the possibility of myocardial infarction.  A Troponin value >0.50 ng/mL is strongly suggestive of acute  myocardial infarction (AMI).   . Ventricular rate 12/24/2016 53  BPM Incomplete   . Atrial Rate 12/24/2016 53  BPM Incomplete   . PR Interval 12/24/2016 158  ms Incomplete   . QRS Duration 12/24/2016 80  ms Incomplete   . QT Interval 12/24/2016 406  ms Incomplete   . QTC Calculation 12/24/2016 380  ms Incomplete   . Calculated P Axis 12/24/2016 40  degrees Incomplete   . Calculated R Axis 12/24/2016 28  degrees Incomplete   . Calculated T Axis 12/24/2016 46  degrees Incomplete    . CHOLESTEROL 12/24/2016 142  120 - 199 mg/dL Final   . HDL CHOL 14/78/2956 60  45 - 65 mg/dL Final   . TRIGLYCERIDES 12/24/2016 71  35 - 135 mg/dL Final   . LDL CALC 21/30/8657 68  <=130 mg/dL Final   . VLDL CALC 84/69/6295 14  5 - 35 mg/dL Final   . CHOL/HDL RATIO 12/24/2016 2.4   Final    Risk Factor for Coronary Artery Disease (CAD) is as follows:                             FEMALE          FEMALE  1/2 Risk of CAD          3.4            3.3  Normal Risk of CAD       5.0            4.4  2x Risk of CAD           9.6            7.0  3x Risk of CAD          23.4           11.0   . FOLATE 12/23/2016 16.2  >=4.5 ng/mL Final   . VITAMIN B 12 12/23/2016 294  180 - 914 pg/mL Final   . TSH 12/23/2016 1.190  0.340 - 5.330 uIU/mL Final   . SYPHILIS TP ANTIBODIES QUALITATIVE 12/23/2016 Non-reactive  Non-reactive Final      No serologic evidence of exposure to syphilis.   Marland Kitchen ERYTHROCYTE SEDIMENTATION RATE (ES* 12/24/2016 19  0 - 20 mm/hr Final   . CRP  INFLAMMATION 12/23/2016 13.9* <=10.0 mg/L Final       Review of reports and notes reveal:   Notes, labs, notes, labs    Review of images:: MRI Brain: normal    Impressions/Recommendations:  Principal Problem:    Stroke-like symptoms  Active Problems:    Headache    Dizziness    HEMICRANIAL MIGRAINE L  Strong FMH MIGRAINE  Hx MHA as a teen  HX OF ANATOMICALLY FUSED L ARM  -normal nonfocal neuro exam  -continue present medications, if headache returns, we may have to place her on a preventative  -will use mymigrainebuddy app to track her headaches  -discussed migraines, BCP and possibly this has triggered her again  [name] Boyer, APRN  12/24/2016, 10:44

## 2016-12-24 NOTE — Care Management Notes (Signed)
12/24/16 1027   Assessment Detail   Assessment Type Admission   Date of Care Management Update 12/24/16   Social Work Plan   Discharge Planning Status initial meeting   Anticipated Discharge Disposition Home   CM will evaluate for rehabilitation potential yes   Plan Home     Patient lives with her parents. She attends [employer], majoring in occupational therapy. She works nights at [employer]. She has her own transportation and does not have any issues getting her medications.  Patient sees a PCP in [place].  She denies any needs for discharge at this time.

## 2016-12-24 NOTE — Care Plan (Signed)
Problem: Patient Care Overview (Adult,OB)  Goal: Plan of Care Review(Adult,OB)  The patient and/or their representative will communicate an understanding of their plan of care   Outcome: Ongoing (see interventions/notes)      Problem: Stroke (Ischemic) (Adult)  Prevent and manage potential problems including:  1. acute neurologic deterioration  2. bladder/bowel dysfunction  3. cognitive impairment  4. communication impairment  5. eating/swallowing impairment  6. fever  7. hemodynamic instability  8. motor/sensory impairment  9. muscle tone abnormal  10. respiratory compromise  11. situational response  12. skin breakdown  13. venous thromboembolism   Goal: Signs and Symptoms of Listed Potential Problems Will be Absent, Minimized or Managed (Stroke)  Signs and symptoms of listed potential problems will be absent, minimized or managed by discharge/transition of care (reference Stroke (Ischemic) (Adult) CPG).   Outcome: Ongoing (see interventions/notes)      Problem: Fall Risk (Adult)  Goal: Identify Related Risk Factors and Signs and Symptoms  Related risk factors and signs and symptoms are identified upon initiation of Human Response Clinical Practice Guideline (CPG).   Outcome: Ongoing (see interventions/notes)      Comments: Care Plan Note    Situation: Stroke-like symptoms    Intervention: Neurological checks completed Q2, TELE #35, SCDs    Response: Pt stable this shift. A&O x 4.  Speech is clear. Pt with L facial droop. Pupils are 2mm, PERRLA, brisk. Reports HA is "almost gone." Denies numbness/tingling to BUE and BLE. Palpable radial and pedal pulses 2+. Cap refill <3 seconds. R grip and dorsi/plantar is strong. L grip is weak and L dorsi/plantar flexion moderate. Gait very unsteady, needs 2 assist to ambulate. Pt aware to call for help getting OOB. Call bell within reach.         Achilles Dunk, RN

## 2016-12-24 NOTE — Discharge Summary (Signed)
[facility]  [place], [place] [address]    DISCHARGE SUMMARY  Conley Canal, FNP-C      PATIENT NAMEAvrielle, [name]  MRN:  [id number]  DOB:  February 13, 1996    ADMISSION DATE:  12/23/2016  DISCHARGE DATE:  12/24/2016    ATTENDING PHYSICIAN: Conley Canal, FNP-C  PRIMARY CARE PHYSICIAN: No Pcp     ADMISSION DIAGNOSIS: Stroke-like symptoms  DISCHARGE DIAGNOSIS: Migraine aura  Active Hospital Problems    Diagnosis Date Noted    Principle Problem: Stroke-like symptoms 12/23/2016    Headache 12/23/2016    Dizziness 12/23/2016      Resolved Hospital Problems    Diagnosis    No resolved problems to display.     There are no active non-hospital problems to display for this patient.     DISCHARGE MEDICATIONS:     Current Discharge Medication List      Notice     You have not been prescribed any medications.        DISCHARGE INSTRUCTIONS:     DISCHARGE INSTRUCTION - DIET   Diet: RESUME HOME DIET      DISCHARGE INSTRUCTION - ACTIVITY   Activity: AS TOLERATED      ASPIRIN NOT ORDERED AT THIS TIME       REASON FOR HOSPITALIZATION:  This is a 21 y.o., female admitted with stroke-like symptoms.    SIGNIFICANT PHYSICAL FINDINGS:   Completley symptom free since arrival to floor.    SIGNIFICANT LAB:     Results for NAHLA, LUKIN (MRN [id number]) as of 12/24/2016 13:58   Ref. Range 12/24/2016 05:59   CHOLESTEROL Latest Ref Range: 120 - 199 mg/dL 956   HDL-CHOLESTEROL Latest Ref Range: 45 - 65 mg/dL 60   LDL (CALCULATED) Latest Ref Range: <=130 mg/dL 68   TRIGLYCERIDES Latest Ref Range: 35 - 135 mg/dL 71   VLDL (CALCULATED) Latest Ref Range: 5 - 35 mg/dL 14   CHOL/HDL RATIO Unknown 2.4       Results for CHARMEL, PRONOVOST (MRN [id number]) as of 12/24/2016 13:58   Ref. Range 12/23/2016 18:41 12/23/2016 18:44   WBC Latest Ref Range: 4.0 - 11.0 x103/uL 7.0    HGB Latest Ref Range: 12.0 - 15.5 g/dL 84.6    HCT Latest Ref Range: 36.0 - 45.0 % 43.0    PLATELET COUNT Latest Ref Range: 150 - 450 x103/uL 338    RBC Latest Ref  Range: 4.00 - 5.10 x106/uL 5.01    MCV Latest Ref Range: 82.0 - 97.0 fL 85.9    MCHC Latest Ref Range: 32.0 - 36.0 g/dL 96.2    MCH Latest Ref Range: 27.5 - 33.2 pg 27.8    RDW Latest Ref Range: 11.0 - 16.0 % 14.1    MPV Latest Ref Range: 7.4 - 10.5 fL 7.3 (L)    PMN'S Latest Ref Range: 43 - 76 % 48    LYMPHOCYTES Latest Ref Range: 15 - 43 % 31    EOSINOPHIL Latest Ref Range: 0 - 5 % 4    MONOCYTES Latest Ref Range: 5 - 12 % 17 (H)    BASOPHILS Latest Ref Range: 0 - 3 % 1    PMN ABS Latest Ref Range: 1.50 - 6.50 x103/uL 3.30    LYMPHS ABS Latest Ref Range: 1.00 - 4.80 x103/uL 2.20    EOS ABS Latest Ref Range: 0.00 - 0.50 x103/uL 0.30    MONOS ABS Latest Ref Range: 0.20 - 0.90 x103/uL 1.20 (H)  BASOS ABS Latest Ref Range: 0.00 - 0.10 x103/uL 0.00    PROTHROMBIN TIME Latest Ref Range: 9.4 - 12.5 seconds 11.9    INR Unknown 1.08    aPTT Latest Ref Range: 25.1 - 36.5 seconds 30.8    SODIUM Latest Ref Range: 136 - 145 mmol/L  137   POTASSIUM Latest Ref Range: 3.4 - 5.1 mmol/L  3.9   CHLORIDE Latest Ref Range: 101 - 111 mmol/L  99 (L)   CARBON DIOXIDE Latest Ref Range: 22 - 32 mmol/L  29   BUN Latest Ref Range: 6 - 20 mg/dL  5 (L)   CREATININE Latest Ref Range: 0.44 - 1.00 mg/dL  3.47   GLUCOSE Latest Ref Range: 70 - 110 mg/dL  94   ANION GAP Latest Ref Range: 3 - 11 mmol/L  9   BUN/CREAT RATIO Latest Ref Range: 6 - 22   6   ESTIMATED GLOMERULAR FILTRATION RATE Latest Ref Range: >60 mL/min/1.34m2  >60   CALCIUM Latest Ref Range: 8.6 - 10.3 mg/dL  9.5   TSH Latest Ref Range: 0.340 - 5.330 uIU/mL  1.190   TOTAL PROTEIN Latest Ref Range: 6.4 - 8.3 g/dL  7.3   ALBUMIN Latest Ref Range: 3.5 - 5.0 g/dL  3.7   BILIRUBIN, TOTAL Latest Ref Range: 0.3 - 1.2 mg/dL  0.7   AST (SGOT) Latest Ref Range: 15 - 41 U/L  35   ALT (SGPT) Latest Ref Range: 14 - 54 U/L  37   ALKALINE PHOSPHATASE Latest Ref Range: 38 - 126 U/L  59   LIPASE Latest Ref Range: 22 - 51 U/L  14 (L)   ALBUMIN/GLOBULIN RATIO Latest Ref Range: 0.8 - 2.0   1.0      FOLATE Latest Ref Range: >=4.5 ng/mL  16.2   VITAMIN B12 Latest Ref Range: 180 - 914 pg/mL  294   LACTIC ACID Latest Ref Range: 0.5 - 2.0 mmol/L 0.6    C-REACTIVE PROTEIN HIGH SENSITIVITY (INFLAMMATION) Latest Ref Range: <=10.0 mg/L  13.9 (H)   HCG QUANTITATIVE/PREG Latest Ref Range: <5 IU/L  <5   SYPHILIS TP ANTIBODIES QUALITATIVE Latest Ref Range: Non-reactive   Non-reactive       SIGNIFICANT RADIOLOGY/DISGNOSTIC TESTS:     MRI Brain:  IMPRESSION:  Unremarkable MRI of the brain    CONSULTATIONS:   Neurology - J. Triggs, APRN    PROCEDURES PERFORMED:   NONE      Physical exam :  GENERAL: The patient is alert and oriented to time, place and person.   HEENT: Normocephalic, anicteric. No pallor. PERRL.   NECK: Supple. No jugular venous distention.   LUNGS: clear breath sounds are audible bilaterally. Good bilateral air entry.  HEART: Both first and second sounds are audible, regular sinus rhythm. No murmur.  ABDOMEN: Soft, bowel sounds are present, nontender   EXTREMITIES: No edema. Peripheral pulses are present.   CENTRAL NERVOUS SYSTEM: No focal deficit, no cranial nerve palsy.   SKIN: No rashes or bruises.   MUSCULOSKELETAL SYSTEM: No deformity or swelling.   PSYCHIATRIC: No anxiety or depression.   HEMATOLOGIC: No ecchymosis, petechia or hematoma.    Please see the H&P by Dr. Bing Quarry for admission details    Excerpt from admission HPI:  HPI: [name] is a 21 y.o., Black/African American female who presents on referral from an urgent care center where she presented this evening with headache.  She was noted at the urgent care center to have a left facial droop  and some weakness of her left extremities and was referred to emergency department for further evaluation.  Patient states she has had this headache since earlier today.  She denies slurred speech or altered mental status.  Although family who were present with her in the emergency department stated that she was acting differently, with slowing of her  mentation.  She has been alert and oriented x3.  She was noted in the emergency department to have a mild left facial droop and weakness of her left upper and lower extremities.  Patient is a nonsmoker, nondrinker and denies any history of illicit drug usage.  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  She denies trauma, chest pain, shortness of breath, difficulty swallowing, visual disturbance, incontinence, abdominal pain, vomiting.  She denies paresthesias.  She does admit to some nausea occurring with the headache today.  She has no history of chronic headaches or migraines.  She also relates a recent change in her birth control method, she was previously on Depo injection and was converted to OCP 4 days ago.  She denies any over-the-counter medications, other prescribed medications, supplements.  ED evaluation included normal laboratory studies and an MRI which was negative for acute pathology.  She did undergo a lumbar puncture which was essentially unremarkable.  Her left-sided weakness has improved but does persist and she will be admitted for further evaluation and treatment of stroke-like symptoms.    COURSE IN HOSPITAL: As above.    The patient remained symptom free during hospitalization. She does report mild frontal headache but no deficits. She was seen by neurology and diagnosed with migraine aura. No specific treatment but she was told to keep a journal of any symptoms and bring to her follow-up appointment, which has been scheduled for 03/24/17. She was also told by neuro to continue taking her oral contraceptives. She is discharged on no new medications. She has no PCP but prefers to find one on her own.    CONDITION ON DISCHARGE: Alert, Oriented and VS Stable    DISCHARGE DISPOSITION:  Home discharge     cc: Primary Care Physician:  No Pcp  No address on file     QM:[id number] Physician:  No referring provider defined for this encounter.

## 2016-12-24 NOTE — OT Treatment (Signed)
OT orders received.  Pt was discharged to home prior to evaluation being completed.  Please consult OP or home health OT, if evaluation is still desired.    Zerita Boers, OT  12/24/2016, 14:29

## 2016-12-24 NOTE — Nurses Notes (Signed)
AVS reviewed with patient. Mother present. Took patient to lobby via WC to meet ride on [place].

## 2016-12-25 LAB — ECG 12-LEAD
Atrial Rate: 70 {beats}/min
Atrial Rate: 53 {beats}/min
Calculated P Axis: 58 degrees
Calculated P Axis: 40 degrees
Calculated R Axis: 57 degrees
Calculated R Axis: 28 degrees
Calculated T Axis: 45 degrees
Calculated T Axis: 46 degrees
PR Interval: 142 ms
PR Interval: 158 ms
PR Interval: 158 ms
QRS Duration: 82 ms
QRS Duration: 80 ms
QT Interval: 366 ms
QT Interval: 406 ms
QT Interval: 406 ms
QTC Calculation: 395 ms
QTC Calculation: 380 ms
Ventricular rate: 70 {beats}/min
Ventricular rate: 53 {beats}/min

## 2016-12-25 LAB — MULTIPLE SCLEROSIS (MS) PROFILE, SERUM AND SPINAL FLUID
ALBUMIN, CSF: 9.4 mg/dL (ref <=27.0)
ALBUMIN, S: 4090 mg/dL (ref 3200–4800)
CSF BANDS: 0 bands
CSF OLIG BANDS INTERPRETATION: 0 bands (ref <4)
IGG INDEX, CSF: 0.52 (ref <=0.85)
IGG, CSF: 1.2 mg/dL (ref <=8.1)
IGG, S: 1040 mg/dL (ref 767–1590)
IGG/ALBUMIN, CSF: 0.13 (ref <=0.21)
IGG/ALBUMIN, S: 0.25 (ref <=0.40)
SERUM BANDS: 0 bands
SYNTHESIS RATE, CSF: 0 mg/(24.h) (ref <=12)

## 2016-12-25 LAB — LUPUS ANTICOAGULANT
LAC NORMALIZED RATIO: 1.17 (ref <=1.20)
PATHOLOGIST INTERPRETATION LUPUS: NORMAL
SCT NORMALIZED RATIO: 0.88 (ref <=1.16)

## 2016-12-25 LAB — TOXICOLOGY SCREEN, SERUM
ACETONE: NOT DETECTED
ETHANOL: NOT DETECTED
ISOPROPANOL: NOT DETECTED
METHANOL: NOT DETECTED

## 2016-12-25 LAB — FACTOR 2 (GENE MUTATION): PROTHROMBIN GENE MUTATION PCR: NEGATIVE

## 2016-12-25 LAB — ACTIVATED PROTEIN C RESISTANCE V (APCRV), PLASMA: ACTIVATED PROTEIN C RESISTANCE V (APCRV) RATIO: 2.6 (ref >=2.3)

## 2016-12-25 LAB — ANTITHROMBIN ACTIVITY, PLASMA: ANTITHROMBIN ACTIVITY: 91 % (ref 83–128)

## 2016-12-25 LAB — HOMOCYSTEINE, TOTAL, PLASMA: HOMOCYSTEINE: 7.53 umol/L (ref 3.00–11.00)

## 2016-12-27 LAB — CSF CULTURE WITH GRAM STAIN
CSF CULTURE: NO GROWTH
GRAM STAIN: NONE SEEN

## 2016-12-28 ENCOUNTER — Encounter (HOSPITAL_COMMUNITY): Payer: Self-pay

## 2016-12-29 LAB — PHOSPHOLIPID (CARDIOLIPIN) IGG & IGM
PHOSPHOLIPID (CARDIOLIPIN) ANTIBODIES, IGG, SERUM: <9.4 [GPL'U]
PHOSPHOLIPID (CARDIOLIPIN) ANTIBODIES, IGM, SERUM: <9.4 [MPL'U]

## 2016-12-29 LAB — PROTEIN C ACTIVITY, PLASMA: PROTEIN C ACTIVITY: 112 % (ref 70–140)

## 2016-12-29 LAB — PROTEIN S ANTIGEN, FREE: PROTEIN S ANTIGEN, FREE: 88 % (ref 54.7–123.7)

## 2017-03-24 ENCOUNTER — Encounter (INDEPENDENT_AMBULATORY_CARE_PROVIDER_SITE_OTHER): Payer: Self-pay | Admitting: Family

## 2017-05-03 ENCOUNTER — Other Ambulatory Visit (HOSPITAL_BASED_OUTPATIENT_CLINIC_OR_DEPARTMENT_OTHER): Payer: Managed Care, Other (non HMO) | Attending: Family

## 2017-05-03 ENCOUNTER — Ambulatory Visit (INDEPENDENT_AMBULATORY_CARE_PROVIDER_SITE_OTHER): Payer: Managed Care, Other (non HMO) | Admitting: Family

## 2017-05-03 ENCOUNTER — Encounter (INDEPENDENT_AMBULATORY_CARE_PROVIDER_SITE_OTHER): Payer: Self-pay | Admitting: Family

## 2017-05-03 VITALS — BP 132/92 | HR 73 | Temp 98.7°F | Ht 65.0 in | Wt 218.2 lb

## 2017-05-03 DIAGNOSIS — R1084 Generalized abdominal pain: Secondary | ICD-10-CM

## 2017-05-03 DIAGNOSIS — R1083 Colic: Secondary | ICD-10-CM

## 2017-05-03 DIAGNOSIS — R197 Diarrhea, unspecified: Secondary | ICD-10-CM | POA: Insufficient documentation

## 2017-05-03 LAB — GI PANEL BY BIOFIRE FILM ARRAY
CAMPYLOBACTER: NOT DETECTED
CRYPTOSPORIDIUM: NOT DETECTED
E. COLI O157: DETECTED — CR
ENTAMOEBA HISTOLYTICA: NOT DETECTED
ENTEROAGGREGATIVE E. COLI (EAEC): NOT DETECTED
ENTEROTOXIGENIC E COLI (ETEC) LT/ST: NOT DETECTED
GIARDIA LAMBLIA: NOT DETECTED
NOROVIRUS GI/GII: NOT DETECTED
PLESIOMONAS SHIGELLOIDES: NOT DETECTED
ROTAVIRUS A: NOT DETECTED
SALMONELLA SPECIES: NOT DETECTED
SAPOVIRUS: NOT DETECTED
SHIGA-LIKE TOXIN-PRODUCING E COLI (STEC) STX1/STX2: DETECTED — CR
SHIGELLA/ENTEROINVASIVE E COLI (EIEC): NOT DETECTED
VIBRIO CHOLERAE: NOT DETECTED
VIBRIO: NOT DETECTED
YERSINIA ENTEROCOLITICA: NOT DETECTED

## 2017-05-03 MED ORDER — DICYCLOMINE 20 MG TABLET: 20 mg | Tab | Freq: Four times a day (QID) | ORAL | 0 refills | 0 days | Status: AC

## 2017-05-03 NOTE — Nursing Note (Signed)
BP (!) 132/92  Pulse 73  Temp 37.1 C (98.7 F) (Oral)   Ht 1.651 m (5\' 5" )  Wt 99 kg (218 lb 3.2 oz)  SpO2 98%  BMI 36.31 kg/m2  Alanson Aly, RTR  05/03/2017, 08:45

## 2017-05-03 NOTE — Progress Notes (Signed)
[facility]  UR[facility]ENT CARE, [facility]  [address]  [place] [address]  Dept: [phone]  Dept Fax: [phone]  Loc: [phone]  Loc Fax: [phone]     Patient Name: Laurance Flatten  Date of Service: 05/03/17  Date of Birth: [birth year]    Chief Complaint:   Chief Complaint     Abdominal Pain x 1 day    Diarrhea               HPI: Cayli Brehm is a 21 y.o. female who presents today with Abdominal Pain (x 1 day) and Diarrhea.  Patient presents with x1 day acute onset of colicky abdominal pain which is worse with food/fluid intake and greater than 10 episodes of small amounts liquid watery nonbloody diarrhea.   She states she ate breakfast yesterday morning and the diarrhea started shortly after eating.  She states she has been sipping on fluids and this makes the cramps worse. Denies nausea, vomiting, fever, chills, sore throat.  Positive ill contacts in the home with similar symptoms.  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  She reports the crampy pain is a 10/10 when it occurs and it is intermittent in nature with episodes resolving to a 2/10.  No prior abdominal surgery.   She is sexually active,  but reports using birth control.   She denies the possibility of being pregnant.   No dysuria, frequency, urgency.  No lower back pain.    History:  Vital signs and history as obtained by clinical staff.  Past Medical History:   Diagnosis Date   . Hx of seasonal allergies                Family Medical History     None            Social History     Social History   . Marital status: Single     Spouse name: N/A   . Number of children: N/A   . Years of education: N/A     Social History Main Topics   . Smoking status: Never Smoker   . Smokeless tobacco: Never Used   . Alcohol use Yes      Comment: rarely   . Drug use: No   . Sexual activity: Not on file     Other Topics Concern   . Not on file     Social History Narrative     Expanded  Substance History     Additional history       Allergies:  No Known Allergies  Problem List:  Patient Active Problem List    Diagnosis   . Stroke-like symptoms   . Headache   . Dizziness     Medication:  Outpatient Encounter Prescriptions as of 05/03/2017   Medication Sig Dispense Refill   . dicyclomine (BENTYL) 20 mg Oral Tablet Take 1 Tab (20 mg total) by mouth Four times a day for 7 days 28 Tab 0     No facility-administered encounter medications on file as of 05/03/2017.        Review of Systems:  Pertinent items are noted in HPI. All pertinent positives and negatives noted in the HPIAs plus below:  Constitutional: No recent illness, no fever, no chills  Neuro: No dizziness.  Eyes: No blurring of vision   ENT: No sore throat.  No sinus pains. No nasal congestion.  Respiratory: No cough.   Cardiovascular: No chest pain.    Muskuloskeletal: No myalgias. No arthralgias.  GI: + abdominal pain. No nausea, vomiting, + diarrhea.  GU: No dysuria, frequency of urination.  Skin: No rashes.    Exam:  General Vitals: BP (!) 132/92  Pulse 73  Temp 37.1 C (98.7 F) (Oral)   Ht 1.651 m (5\' 5" )  Wt 99 kg (218 lb 3.2 oz)  SpO2 98%  BMI 36.31 kg/m2  General:   Healthy-appearing, mild distress  Eyes: Conjunctivae/corneas clear  Head - normocephalic, atraumatic.  Neck- supple  HENT: Mouth mucous membranes moist. Pharynx without injection or exudate. No oral lesions.   Lungs: clear to auscultation bilaterally. No rales no rhonchi no wheezing  Cardiovascular: Heart regular rate and rhythm, S1, S2 normal, no murmur, click, rub or gallop  Abdomen: soft,  non-distended, no hepatosplenomegaly,no guarding, no rebound TTP, + hyperactive BS all quadrants  Skin: Skin color, texture, turgor normal.   Neurologic: alert and oriented x3. Ambulates without difficulty      Assessment and Plan:    ICD-10-CM    1. Diarrhea, unspecified type R19.7 GI PANEL BY BIOFIRE FILM ARRAY   2. Colicky abdominal pain R10.83      Medication Orders    Medications   . dicyclomine (BENTYL) 20 mg Oral Tablet     Sig: Take 1 Tab (20 mg total) by mouth Four times a day for 7 days     Dispense:  28 Tab     Refill:  0     Appears to be viral in origin.  Sips of fluid to prevent dehydration.  GI Biofire ordered will call with results if change of plan is necessary.  Recommended Tylenol or ibuprofen for crampy abdominal pain.  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 Recommend she not take Imodium. Advised that if her symptoms worsen including fever, bloody diarrhea, worsening pain that she should present immediately to the emergency department for further evaluation.  Patient verbalized understanding agrees with plan of care, questions answered.  Return if symptoms worsen or fail to improve, for follow up with PCP.    Fleeta Emmer, Dyann Ruddle, CPNP-PC   Supervising Physician Dr. Raleigh Callas      Portions of this note may be dictated using voice recognition software or a dictation service. Variances in spelling and vocabulary are possible and unintentional. Not all errors are caught/corrected. Please notify the [facility] if any discrepancies are noted or if the meaning of any statement is not clear.

## 2017-05-03 NOTE — Patient Instructions (Addendum)
[address] [facility]  [address]  [place] [address]  Phone: [phone]  Fax: [phone]           Open Daily 8:00am - 8:00pm, except Sundays 12pm-8pm         ~ Closed Thanksgiving and Christmas Day     Attending Caregiver: Fleeta Emmer, APRN    Today's orders:   Orders Placed This Encounter   . GI PANEL BY BIOFIRE FILM ARRAY   . dicyclomine (BENTYL) 20 mg Oral Tablet        Prescription(s) E-Rx to:  [id number] - [place], [place] - [address]    ________________________________________________________________________  Short Term Disability and Family Medical Leave Act  [facility] does NOT provide assistance with any disability applications.  If you feel your medical condition requires you to be on disability, you will need to follow up with  Your primary care physician or a specialist.  We apologize for any inconvenience.    For Medication Prescribed by [facility]:  As an Urgent Care facility, our clinic does NOT offer prescription refills over the telephone.    If you need more of the medication one of our medical providers prescribed, you will  Either need to be re-evaluated by [place] or see your primary care physician.    ________________________________________________________________________      It is very important that we have a phone number that is the single best way to contact you in the event that we become aware of important clinical information or concerns after your discharge.  If the phone number you provided at registration is NOT this number you should inform staff and registration prior to leaving.      Your treatment and evaluation today was focused on identifying and treating potentially emergent conditions based on your presenting signs, symptoms, and history.  The resulting initial clinical impression and treatment plan is not intended to be definitive or a substitute for a full physical examination and evaluation by your primary care provider.  If  your symptoms persist, worsen, or you develop any new or concerning symptoms, you need to be evaluated.      If you received x-rays during your visit, be aware that the final and formal interpretation of those films by a radiologist may occur after your discharge.  If there is a significant discrepancy identified after your discharge, we will contact you at the telephone number provided at registration.      If you received a pelvic exam, you may have cultures pending for sexually transmitted diseases.  Positive cultures are reported to the [facility] Department of Health as required by state law.  You should be contacted if you cultures are positive.  We will not contact you if they are negative.  You did NOT receive a PAP smear (the screening test for cervical).  This specific test for women is best performed by your gynecologist or primary care provider when indicated.      If you are over 91 year old, we cannot discuss your personal health information with a parent, spouse, family member, or anyone else without your express consent.  This does not include those who have legitimate access to your records and information to assist in your care under the provisions of HIPAA [facility] Portability and Accountability Act) law, or those to whom you have previously given express written consent to do so, such a legal guardian or Power of [place].  You may have received medication that may cause you to feel drowsy and/or light headed for several hours.  You may even experience some amnesia of your stay.  You should avoid operating a motor vehicle or performing any activity requiring complete alertness or coordination until you feel fully awake (approximately 24-48 hours).  Avoid alcoholic beverages.  You may also have a dry mouth for several hours.  This is a normal side effect and will disappear as the effects of the medication wear off.      Instructions discussed with patient upon discharge by clinical staff with  all questions answered.  Please call [facility] [phone] if any further questions.  Go immediately to the emergency department if any concern or worsening symptoms.    Fleeta Emmer, APRN 05/03/2017, 09:01        Treating Diarrhea    Diarrhea happens when you have loose, watery, or frequent bowel movements. It is a common problem with many causes. Most cases of diarrhea clear up on their own. But certain cases may need treatment. Be sure to see your healthcare providerif your symptoms do not improve within a few days.  Getting relief  Treatment of diarrhea depends on its cause. Diarrhea caused by bacterial or parasite infection is often treated with antibiotics. Diarrhea caused by other factors, such as a stomach virus, often improves with simple home treatment. The tips below may also help relieve your symptoms.   Drink plenty of fluids. This helps prevent too much fluid loss (dehydration). Water, clear soups, and electrolyte solutions are good choices. Avoid alcohol, coffee, tea, and milk. These can irritate your intestines andmake symptoms worse.   Suck on ice chips if drinking makes you queasy.   Return to your normal diet slowly. You may want to eat bland foods at first, such as rice and toast. Also, you may need to avoid certain foods for a while, such as dairy products. These can make symptoms worse. Ask yourhealthcare providerif there are any other foods you should avoid.   If you were prescribed antibiotics, take them as directed.   Do not take anti-diarrhea medicines without asking yourhealthcare providerfirst.  Call your healthcare provider  Call your healthcare provider if you have any of the following:   A fever of 100.37F (38.0C) or higher, or as directed by your healthcare provider   Severe pain   Worsening diarrhea or diarrhea for more than 2 days   Bloody vomit or stool   Signs of dehydration (dizziness, dry mouth and tongue, rapid pulse, dark urine)  Date Last Reviewed:  03/15/2015   2000-2017 [employer] [address], [place], [place] [address]. All rights reserved. This information is not intended as a substitute for professional medical care. Always follow your healthcare professional's instructions.

## 2017-05-04 ENCOUNTER — Encounter (INDEPENDENT_AMBULATORY_CARE_PROVIDER_SITE_OTHER): Payer: Self-pay | Admitting: Family

## 2017-05-04 ENCOUNTER — Encounter (INDEPENDENT_AMBULATORY_CARE_PROVIDER_SITE_OTHER): Payer: Self-pay | Admitting: Family Medicine

## 2017-05-04 ENCOUNTER — Encounter (INDEPENDENT_AMBULATORY_CARE_PROVIDER_SITE_OTHER): Payer: Self-pay

## 2017-05-04 NOTE — Nursing Note (Signed)
Pt seen in urgent care yesterday. Attempted to call for follow up, no answer at this time. Left VM to call back with any questions or concerns.

## 2017-07-20 ENCOUNTER — Encounter (HOSPITAL_BASED_OUTPATIENT_CLINIC_OR_DEPARTMENT_OTHER): Payer: Self-pay

## 2017-07-20 ENCOUNTER — Emergency Department (HOSPITAL_BASED_OUTPATIENT_CLINIC_OR_DEPARTMENT_OTHER)
Admission: EM | Admit: 2017-07-20 | Discharge: 2017-07-20 | Disposition: A | Discharge status: Home / Self Care | Payer: Auto Insurance (includes no fault) | Attending: Emergency Medicine | Admitting: Emergency Medicine

## 2017-07-20 ENCOUNTER — Emergency Department (HOSPITAL_BASED_OUTPATIENT_CLINIC_OR_DEPARTMENT_OTHER): Payer: Auto Insurance (includes no fault)

## 2017-07-20 DIAGNOSIS — S0990XA Unspecified injury of head, initial encounter: Secondary | ICD-10-CM | POA: Insufficient documentation

## 2017-07-20 MED ORDER — KETOROLAC 30 MG/ML (1 ML) INJECTION SOLUTION
30.00 mg | INTRAMUSCULAR | Status: AC
Start: 2017-07-20 — End: 2017-07-20
  Administered 2017-07-20: 30 mg via INTRAVENOUS
  Filled 2017-07-20: qty 1

## 2017-07-20 MED ORDER — DICLOFENAC SODIUM 75 MG TABLET,DELAYED RELEASE
75.00 mg | DELAYED_RELEASE_TABLET | Freq: Two times a day (BID) | ORAL | 0 refills | Status: DC
Start: 2017-07-20 — End: 2021-06-11

## 2017-07-20 MED ORDER — METOCLOPRAMIDE 5 MG/ML INJECTION SOLUTION
10.00 mg | INTRAMUSCULAR | Status: AC
Start: 2017-07-20 — End: 2017-07-20
  Administered 2017-07-20: 10 mg via INTRAVENOUS
  Filled 2017-07-20: qty 2

## 2017-07-20 MED ORDER — PREDNISONE 50 MG TABLET
50.0000 mg | ORAL_TABLET | Freq: Every day | ORAL | 0 refills | Status: DC
Start: 2017-07-20 — End: 2021-06-11

## 2017-07-20 MED ORDER — DIPHENHYDRAMINE 50 MG/ML INJECTION SOLUTION
25.00 mg | INTRAMUSCULAR | Status: AC
Start: 2017-07-20 — End: 2017-07-20
  Administered 2017-07-20: 25 mg via INTRAVENOUS
  Filled 2017-07-20: qty 1

## 2017-07-20 MED ORDER — HYDROMORPHONE 2 MG/ML INJECTION SOLUTION
1.00 mg | INTRAMUSCULAR | Status: AC
Start: 2017-07-20 — End: 2017-07-20
  Administered 2017-07-20: 1 mg via INTRAVENOUS
  Filled 2017-07-20: qty 1

## 2017-07-20 MED ORDER — SODIUM CHLORIDE 0.9 % IV BOLUS
1000.00 mL | INJECTION | Status: AC
Start: 2017-07-20 — End: 2017-07-20
  Administered 2017-07-20: 0 mL via INTRAVENOUS
  Administered 2017-07-20: 1000 mL via INTRAVENOUS

## 2017-07-20 MED ORDER — DIAZEPAM 5 MG TABLET
5.00 mg | ORAL_TABLET | Freq: Four times a day (QID) | ORAL | 0 refills | Status: DC | PRN
Start: 2017-07-20 — End: 2021-06-11

## 2017-07-20 MED ADMIN — lactated Ringers intravenous solution: INTRAVENOUS | @ 21:00:00 | NDC 00264775000
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
## 2017-07-20 NOTE — ED Nurses Note (Signed)
Patient discharged home with family.  AVS reviewed with patient/care giver.  A written copy of the AVS and discharge instructions was given to the patient/care giver.  Questions sufficiently answered as needed.  Patient/care giver encouraged to follow up with PCP as indicated.  In the event of an emergency, patient/care giver instructed to call 911 or go to the nearest emergency room.        Current Discharge Medication List      START taking these medications.       Details    diazePAM 5 mg Tablet   Commonly known as:  VALIUM    5 mg, Oral, Q6H PRN   Qty:  12 Tab   Refills:  0       diclofenac sodium 75 mg Tablet, Delayed Release (E.C.)   Commonly known as:  VOLTAREN    75 mg, Oral, 2x/day   Qty:  14 Tab   Refills:  0       predniSONE 50 mg Tablet   Commonly known as:  DELTASONE    50 mg, Oral, Daily   Qty:  5 Tab   Refills:  0         CONTINUE these medications - NO CHANGES were made during your visit.       Details    NEXPLANON SDRM    Subdermal   Refills:  0

## 2017-07-20 NOTE — ED Nurses Note (Signed)
[facility] father and this staff.
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
## 2017-07-20 NOTE — ED Nurses Note (Signed)
Patient taken to CT via wheelchair.
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
## 2018-01-01 ENCOUNTER — Encounter (HOSPITAL_COMMUNITY): Payer: Self-pay | Admitting: Emergency Medicine

## 2018-01-01 ENCOUNTER — Ambulatory Visit (HOSPITAL_COMMUNITY)
Admission: EM | Admit: 2018-01-01 | Discharge: 2018-01-01 | Disposition: A | Discharge status: Home / Self Care | Payer: 59 | Attending: Family Medicine | Admitting: Family Medicine

## 2018-01-01 DIAGNOSIS — J209 Acute bronchitis, unspecified: Secondary | ICD-10-CM | POA: Diagnosis not present

## 2018-01-01 DIAGNOSIS — J302 Other seasonal allergic rhinitis: Secondary | ICD-10-CM

## 2018-01-01 HISTORY — DX: Other seasonal allergic rhinitis: J30.2

## 2018-01-01 MED ORDER — FLUTICASONE PROPIONATE 50 MCG/ACT NA SUSP: 1.0000 | Freq: Every day | NASAL | 2 refills | Status: DC

## 2018-01-01 MED ORDER — IPRATROPIUM-ALBUTEROL 0.5-2.5 (3) MG/3ML IN SOLN
RESPIRATORY_TRACT | Status: AC
  Filled 2018-01-01: qty 3

## 2018-01-01 MED ORDER — ALBUTEROL SULFATE HFA 108 (90 BASE) MCG/ACT IN AERS: 1.0000 | INHALATION_SPRAY | Freq: Four times a day (QID) | RESPIRATORY_TRACT | 0 refills | Status: DC | PRN

## 2018-01-01 MED ORDER — IPRATROPIUM-ALBUTEROL 0.5-2.5 (3) MG/3ML IN SOLN
3.0000 mL | Freq: Once | RESPIRATORY_TRACT | Status: AC
  Administered 2018-01-01: 3 mL via RESPIRATORY_TRACT

## 2018-01-01 MED ORDER — PREDNISONE 20 MG PO TABS: 40.0000 mg | ORAL_TABLET | Freq: Every day | ORAL | 0 refills | Status: AC

## 2018-01-01 NOTE — Discharge Instructions (Signed)
Push fluids to ensure adequate hydration and keep secretions thin.  Continue with allegra d to help with allergy symptoms. Daily flonase. 5 days of prednisone. Use of inhaler as needed for shortness of breath, wheezing or chest tightness. If symptoms worsen or do not improve in the next week to return to be seen or to follow up with your PCP.

## 2018-01-01 NOTE — ED Provider Notes (Signed)
[facility]    CSN: [id number] Arrival date & time: 01/01/18  1746     History   Chief Complaint Chief Complaint  Patient presents with  . Allergies    HPI Brittany Mendoza is a 22 y.o. female.   Brittany Mendoza presents with complaints of persistent allergy symptoms which have been ongoing for the past week causing increased cough and shortness of breath with activity. Wheezing at times. Occasionally cough is productive. Has been taking allegra d which has helped with nasal congestion, no longer with nasal drainage. Mild sore throat. No ear pain. No history of asthma, does not smoke. No fevers or known ill contacts. Denies gi/gu complaints. States she has a history of seasonal allergies but her cough is currently worse than normal for her. Without contributing medical history.   ROS per HPI.      Past Medical History:  Diagnosis Date  . Seasonal allergies     There are no active problems to display for this patient.   History reviewed. No pertinent surgical history.  OB History   None      Home Medications    Prior to Admission medications   Medication Sig Start Date End Date Taking? Authorizing Provider  Fexofenadine-Pseudoephedrine (ALLEGRA-D PO) Take by mouth.   Yes [provider]  albuterol (PROVENTIL HFA;VENTOLIN HFA) 108 (90 Base) MCG/ACT inhaler Inhale 1-2 puffs into the lungs every 6 (six) hours as needed for wheezing or shortness of breath. 01/01/18   Georgetta HaberBurky, Lue [place] B, NP  fluticasone (FLONASE) 50 MCG/ACT nasal spray Place 1 spray into both nostrils daily. 01/01/18   Georgetta HaberBurky, Tishawn Friedhoff B, NP  predniSONE (DELTASONE) 20 MG tablet Take 2 tablets (40 mg total) by mouth daily with breakfast for 5 days. 01/01/18 01/06/18  Georgetta HaberBurky, Kenyatta Keidel B, NP    Family History No family history on file.  Social History Social History   Tobacco Use  . Smoking status: Never Smoker  Substance Use Topics  . Alcohol use: Yes  . Drug use: Never     Allergies     Patient has no known allergies.   Review of Systems Review of Systems   Physical Exam Triage Vital Signs ED Triage Vitals [01/01/18 1822]  Enc Vitals Group     BP 132/80     Pulse Rate 83     Resp 16     Temp 98.1 F (36.7 C)     Temp src      SpO2 100 %     Weight      Height      Head Circumference      Peak Flow      Pain Score      Pain Loc      Pain Edu?      Excl. in GC?    No data found.  Updated Vital Signs BP 132/80   Pulse 83   Temp 98.1 F (36.7 C)   Resp 16   SpO2 100%   Visual Acuity Right Eye Distance:   Left Eye Distance:   Bilateral Distance:    Right Eye Near:   Left Eye Near:    Bilateral Near:     Physical Exam  Constitutional: She is oriented to person, place, and time. She appears well-developed and well-nourished. No distress.  HENT:  Head: Normocephalic and atraumatic.  Right Ear: Tympanic membrane, external ear and ear canal normal.  Left Ear: Tympanic membrane, external ear and ear canal normal.  Nose: Mucosal edema and  rhinorrhea present. Right sinus exhibits no maxillary sinus tenderness and no frontal sinus tenderness. Left sinus exhibits no maxillary sinus tenderness and no frontal sinus tenderness.  Mouth/Throat: Uvula is midline, oropharynx is clear and moist and mucous membranes are normal. No tonsillar exudate.  Eyes: Pupils are equal, round, and reactive to light. Conjunctivae and EOM are normal.  Cardiovascular: Normal rate, regular rhythm and normal heart sounds.  Pulmonary/Chest: Effort normal. She has wheezes in the right upper field and the left upper field.  Faint wheezing noted to upper lung fields although improves with cough; occasional congested cough noted  Neurological: She is alert and oriented to person, place, and time.  Skin: Skin is warm and dry.     UC Treatments / Results  Labs (all labs ordered are listed, but only abnormal results are displayed) Labs Reviewed - No data to  display  EKG None Radiology No results found.  Procedures Procedures (including critical care time)  Medications Ordered in UC Medications  ipratropium-albuterol (DUONEB) 0.5-2.5 (3) MG/3ML nebulizer solution 3 mL (3 mLs Nebulization Given 01/01/18 1921)     Initial Impression / Assessment and Plan / UC Course  I have reviewed the triage vital signs and the nursing notes.  Pertinent labs & imaging results that were available during my care of the patient were reviewed by me and considered in my medical decision making (see chart for details).     Non toxic in appearance. Occasional cough and wheezing noted, duoneb provided in clinic with improvement in cough and sob. Still with upper inspiratory wheezes noted.  Flonase, 5 days of prednisone and albuterol inhaler provided at this time for likely allergic vs viral bronchitis.   Final Clinical Impressions(s) / UC Diagnoses   Final diagnoses:  Seasonal allergies  Acute bronchitis, unspecified organism    ED Discharge Orders        Ordered    fluticasone (FLONASE) 50 MCG/ACT nasal spray  Daily     01/01/18 1920    predniSONE (DELTASONE) 20 MG tablet  Daily with breakfast     01/01/18 1920    albuterol (PROVENTIL HFA;VENTOLIN HFA) 108 (90 Base) MCG/ACT inhaler  Every 6 hours PRN     01/01/18 1920       Controlled Substance Prescriptions [place] Controlled Substance Registry consulted? Not Applicable   Georgetta Haber, NP 01/01/18 1942

## 2018-01-01 NOTE — ED Triage Notes (Signed)
Pt c/o really bad allergies, taking allegra-D, states she feels a lot of mucous in her chest. States if she exerts herself too much she feels SOB

## 2018-03-26 ENCOUNTER — Emergency Department (HOSPITAL_COMMUNITY): Payer: 59

## 2018-03-26 ENCOUNTER — Encounter (HOSPITAL_COMMUNITY): Payer: Self-pay | Admitting: Emergency Medicine

## 2018-03-26 ENCOUNTER — Emergency Department (HOSPITAL_COMMUNITY)
Admission: EM | Admit: 2018-03-26 | Discharge: 2018-03-26 | Disposition: A | Discharge status: Home / Self Care | Payer: 59 | Attending: Emergency Medicine | Admitting: Emergency Medicine

## 2018-03-26 DIAGNOSIS — Y990 Civilian activity done for income or pay: Secondary | ICD-10-CM | POA: Insufficient documentation

## 2018-03-26 DIAGNOSIS — W208XXA Other cause of strike by thrown, projected or falling object, initial encounter: Secondary | ICD-10-CM | POA: Insufficient documentation

## 2018-03-26 DIAGNOSIS — Y929 Unspecified place or not applicable: Secondary | ICD-10-CM | POA: Diagnosis not present

## 2018-03-26 DIAGNOSIS — S060X0A Concussion without loss of consciousness, initial encounter: Secondary | ICD-10-CM | POA: Diagnosis not present

## 2018-03-26 DIAGNOSIS — Y939 Activity, unspecified: Secondary | ICD-10-CM | POA: Diagnosis not present

## 2018-03-26 DIAGNOSIS — Z79899 Other long term (current) drug therapy: Secondary | ICD-10-CM | POA: Diagnosis not present

## 2018-03-26 DIAGNOSIS — S0990XA Unspecified injury of head, initial encounter: Secondary | ICD-10-CM | POA: Diagnosis present

## 2018-03-26 LAB — CBG MONITORING, ED: GLUCOSE-CAPILLARY: 79 mg/dL (ref 70–99)

## 2018-03-26 MED ORDER — ACETAMINOPHEN 325 MG PO TABS: 650.0000 mg | ORAL_TABLET | Freq: Once | ORAL | Status: DC

## 2018-03-26 MED ORDER — ACETAMINOPHEN 500 MG PO TABS
1000.0000 mg | ORAL_TABLET | Freq: Once | ORAL | Status: AC
  Administered 2018-03-26: 1000 mg via ORAL
  Filled 2018-03-26: qty 2

## 2018-03-26 NOTE — ED Notes (Signed)
Patient transported to CT 

## 2018-03-26 NOTE — ED Provider Notes (Signed)
[facility]ER[facility]ENC[facility]PT Provider Note   CSN: [id number] Arrival date & time: 03/26/18  1334     History   Chief Complaint Chief Complaint  Patient presents with  . Head Injury    HPI Brittany Mendoza is a 22 y.o. female.  The history is provided by the patient. No language interpreter was used.  Head Injury     Brittany Mendoza is a 22 y.o. female who presents to the Emergency Department complaining of head injury. She presents to the emergency department for evaluation following head injury that occurred at work today. She was facing a wall when a 10 foot ladder fell and struck her on the left side of her head. She did not lose consciousness but she became weak and fell to the ground. She reports a severe headache as well as dizziness. She feels as if she might fall when she goes to walk. She denies any nausea, vomiting. She does have intermittent blurred vision. She has no medical problems and takes no medications. Symptoms are severe and constant. Past Medical History:  Diagnosis Date  . Seasonal allergies     There are no active problems to display for this patient.   History reviewed. No pertinent surgical history.   OB History   None      Home Medications    Prior to Admission medications   Medication Sig Start Date End Date Taking? Authorizing Provider  albuterol (PROVENTIL HFA;VENTOLIN HFA) 108 (90 Base) MCG/ACT inhaler Inhale 1-2 puffs into the lungs every 6 (six) hours as needed for wheezing or shortness of breath. 01/01/18   Georgetta Haber, NP  fluticasone (FLONASE) 50 MCG/ACT nasal spray Place 1 spray into both nostrils daily. Patient not taking: Reported on 03/26/2018 01/01/18   Georgetta Haber, NP    Family History No family history on file.  Social History Social History   Tobacco Use  . Smoking status: Never Smoker  . Smokeless tobacco: Never Used  Substance Use Topics  . Alcohol use: Yes  . Drug use: Never      Allergies   Patient has no known allergies.   Review of Systems Review of Systems  All other systems reviewed and are negative.    Physical Exam Updated Vital Signs BP 130/82 (BP Location: Left Arm)   Pulse 71   Temp 98 F (36.7 C) (Oral)   Resp 19   SpO2 100%   Physical Exam  Constitutional: She is oriented to person, place, and time. She appears well-developed and well-nourished.  HENT:  Head: Normocephalic.  Swelling and tenderness to the right parietal scalp  Cardiovascular: Normal rate and regular rhythm.  No murmur heard. Pulmonary/Chest: Effort normal and breath sounds normal. No respiratory distress.  Abdominal: Soft. There is no tenderness. There is no rebound and no guarding.  Musculoskeletal: She exhibits no edema or tenderness.  Neurological: She is alert and oriented to person, place, and time.  Cranial nerves grossly intact. Five out of five strength in all four extremities. No pronator drift. No ataxia on finger to nose bilaterally. There is a slightly ataxic and shuffling gait on ambulation.  Skin: Skin is warm and dry.  Psychiatric: She has a normal mood and affect. Her behavior is normal.  Nursing note and vitals reviewed.    ED Treatments / Results  Labs (all labs ordered are listed, but only abnormal results are displayed) Labs Reviewed  CBG MONITORING, ED    EKG None  Radiology Ct Head Wo  Contrast  Result Date: 03/26/2018 CLINICAL DATA:  22 year old female with history of head injury after being struck by a ladder. Dizziness. EXAM: CT HEAD WITHOUT CONTRAST TECHNIQUE: Contiguous axial images were obtained from the base of the skull through the vertex without intravenous contrast. COMPARISON:  None. FINDINGS: Brain: No evidence of acute infarction, hemorrhage, hydrocephalus, extra-axial collection or mass lesion/mass effect. Vascular: No hyperdense vessel or unexpected calcification. Skull: Normal. Negative for fracture or focal lesion.  Sinuses/Orbits: No acute finding. Mucosal thickening in the lateral aspect of the left sphenoid sinus. Other: None. IMPRESSION: 1. No acute intracranial abnormalities. The appearance of the brain is normal. Electronically Signed   By: Trudie Reedaniel  Entrikin M.D.   On: 03/26/2018 15:43    Procedures Procedures (including critical care time)  Medications Ordered in ED Medications  acetaminophen (TYLENOL) tablet 1,000 mg (1,000 mg Oral Given 03/26/18 1539)     Initial Impression / Assessment and Plan / ED Course  I have reviewed the triage vital signs and the nursing notes.  Pertinent labs & imaging results that were available during my care of the patient were reviewed by me and considered in my medical decision making (see chart for details).    Patient here for evaluation of dizziness after getting struck in the head earlier today. She does have some mild ataxia on examination. CT had is negative for acute intracranial abnormality. Discussed with patient home care for concussion. Discussed outpatient follow-up and return precautions.  Final Clinical Impressions(s) / ED Diagnoses   Final diagnoses:  Concussion without loss of consciousness, initial encounter    ED Discharge Orders    None       Tilden Fossaees, Shaelynn Dragos, MD 03/26/18 1601
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
## 2018-12-07 ENCOUNTER — Ambulatory Visit: Payer: 59 | Admitting: Obstetrics and Gynecology

## 2018-12-07 ENCOUNTER — Other Ambulatory Visit: Payer: Self-pay

## 2018-12-07 ENCOUNTER — Encounter: Payer: 59 | Admitting: Obstetrics and Gynecology

## 2018-12-07 ENCOUNTER — Encounter: Payer: Self-pay | Admitting: Obstetrics and Gynecology

## 2018-12-07 VITALS — BP 110/78 | HR 64 | Ht 64.0 in | Wt 225.0 lb

## 2018-12-07 DIAGNOSIS — N760 Acute vaginitis: Secondary | ICD-10-CM

## 2018-12-07 DIAGNOSIS — Z30432 Encounter for removal of intrauterine contraceptive device: Secondary | ICD-10-CM | POA: Diagnosis not present

## 2018-12-07 DIAGNOSIS — B9689 Other specified bacterial agents as the cause of diseases classified elsewhere: Secondary | ICD-10-CM | POA: Diagnosis not present

## 2018-12-07 DIAGNOSIS — Z3009 Encounter for other general counseling and advice on contraception: Secondary | ICD-10-CM | POA: Diagnosis not present

## 2018-12-07 DIAGNOSIS — Z01812 Encounter for preprocedural laboratory examination: Secondary | ICD-10-CM

## 2018-12-07 LAB — POCT URINE PREGNANCY: Preg Test, Ur: NEGATIVE

## 2018-12-07 MED ORDER — METRONIDAZOLE 500 MG PO TABS: 500.0000 mg | ORAL_TABLET | Freq: Two times a day (BID) | ORAL | 0 refills | Status: DC

## 2018-12-07 NOTE — Progress Notes (Signed)
23 y.o. G0P0000 Single Black or African American Not Hispanic or Latino female here for IUD removal and vaginal discharge. She has skyla IUD due for removal. Just started to have monthly cycle in the last 3 months. Same partner x 5 years, plans to just use condoms.  She c/o 1 week h/o a slightly cloudy vaginal d/c. No itching, burning or irritation. No odor.    Period Duration (Days): 7 days Period Pattern: (!) Irregular(due to IUD) Menstrual Flow: Heavy Menstrual Control: Tampon, Thin pad Menstrual Control Change Freq (Hours): changes tampon/pad every 4 hours Dysmenorrhea: (!) Severe Dysmenorrhea Symptoms: Cramping  Patient's last menstrual period was 11/16/2018 (exact date).          Sexually active: Yes.    The current method of family planning is IUD.    Exercising: No.  The patient does not participate in regular exercise at present. Smoker:  no  Health Maintenance: Pap:  01/04/2017 WNL History of abnormal Pap:  no TDaP:  03/2017 Gardasil: completed all 3   reports that she has never smoked. She has never used smokeless tobacco. She reports current alcohol use of about 3.0 standard drinks of alcohol per week. She reports that she does not use drugs. Working at [employer].   Past Medical History:  Diagnosis Date  . Dysmenorrhea   . Seasonal allergies   . STD (sexually transmitted disease)     History reviewed. No pertinent surgical history.  Current Outpatient Medications  Medication Sig Dispense Refill  . fexofenadine-pseudoephedrine (ALLEGRA-D 24) 180-240 MG 24 hr tablet Take 1 tablet by mouth daily.    . fluticasone (FLONASE) 50 MCG/ACT nasal spray Place 1 spray into both nostrils daily. 16 g 2  . omeprazole (PRILOSEC) 10 MG capsule Take 10 mg by mouth daily.     No current facility-administered medications for this visit.     Family History  Problem Relation Age of Onset  . Hypertension Mother   . Thyroid disease Mother   . Diabetes Father   . Hypertension Father    . Bone cancer Maternal Grandmother   . Bone cancer Maternal Grandfather     Review of Systems  Constitutional: Negative.   HENT: Negative.   Eyes: Negative.   Respiratory: Negative.   Cardiovascular: Negative.   Gastrointestinal: Negative.   Endocrine: Negative.   Genitourinary: Positive for vaginal discharge.  Musculoskeletal: Negative.   Skin: Negative.   Allergic/Immunologic: Negative.   Neurological: Negative.   Hematological: Negative.   Psychiatric/Behavioral: Negative.     Exam:   BP 110/78 (BP Location: Right Arm, Patient Position: Sitting, Cuff Size: Large)   Pulse 64   Ht 5\' 4"  (1.626 m)   Wt 225 lb (102.1 kg)   LMP 11/16/2018 (Exact Date)   BMI 38.62 kg/m   Weight change: @WEIGHTCHANGE @ Height:   Height: 5\' 4"  (162.6 cm)  Ht Readings from Last 3 Encounters:  12/07/18 5\' 4"  (1.626 m)    General appearance: alert, cooperative and appears stated age   Pelvic: External genitalia:  no lesions              Urethra:  normal appearing urethra with no masses, tenderness or lesions              Bartholins and Skenes: normal                 Vagina: normal appearing vagina with an increase in watery, frothy vaginal discharge with an odor  Cervix: no lesions and IUD string 3 cm. IUD removed with ringed forceps.                Chaperone was present for exam.  Wet prep: + clue, no trich, rare wbc KOH: no yeast PH: 5   A:  IUD removal  Contraception  Vaginal discharge, BV  P:   She will use condoms for contraception  Use multivitamins for folic acid  Flagyl for BV, no ETOH  F/U for annual in 4/20

## 2018-12-07 NOTE — Patient Instructions (Signed)
Bacterial Vaginosis    Bacterial vaginosis is a vaginal infection that occurs when the normal balance of bacteria in the vagina is disrupted. It results from an overgrowth of certain bacteria. This is the most common vaginal infection among women ages 15-44.  Because bacterial vaginosis increases your risk for STIs (sexually transmitted infections), getting treated can help reduce your risk for chlamydia, gonorrhea, herpes, and HIV (human immunodeficiency virus). Treatment is also important for preventing complications in pregnant women, because this condition can cause an early (premature) delivery.  What are the causes?  This condition is caused by an increase in harmful bacteria that are normally present in small amounts in the vagina. However, the reason that the condition develops is not fully understood.  What increases the risk?  The following factors may make you more likely to develop this condition:  · Having a new sexual partner or multiple sexual partners.  · Having unprotected sex.  · Douching.  · Having an intrauterine device (IUD).  · Smoking.  · Drug and alcohol abuse.  · Taking certain antibiotic medicines.  · Being pregnant.  You cannot get bacterial vaginosis from toilet seats, bedding, swimming pools, or contact with objects around you.  What are the signs or symptoms?  Symptoms of this condition include:  · Grey or white vaginal discharge. The discharge can also be watery or foamy.  · A fish-like odor with discharge, especially after sexual intercourse or during menstruation.  · Itching in and around the vagina.  · Burning or pain with urination.  Some women with bacterial vaginosis have no signs or symptoms.  How is this diagnosed?  This condition is diagnosed based on:  · Your medical history.  · A physical exam of the vagina.  · Testing a sample of vaginal fluid under a microscope to look for a large amount of bad bacteria or abnormal cells. Your health care provider may use a cotton swab or  a small wooden spatula to collect the sample.  How is this treated?  This condition is treated with antibiotics. These may be given as a pill, a vaginal cream, or a medicine that is put into the vagina (suppository). If the condition comes back after treatment, a second round of antibiotics may be needed.  Follow these instructions at home:  Medicines  · Take over-the-counter and prescription medicines only as told by your health care provider.  · Take or use your antibiotic as told by your health care provider. Do not stop taking or using the antibiotic even if you start to feel better.  General instructions  · If you have a female sexual partner, tell her that you have a vaginal infection. She should see her health care provider and be treated if she has symptoms. If you have a female sexual partner, he does not need treatment.  · During treatment:  ? Avoid sexual activity until you finish treatment.  ? Do not douche.  ? Avoid alcohol as directed by your health care provider.  ? Avoid breastfeeding as directed by your health care provider.  · Drink enough water and fluids to keep your urine clear or pale yellow.  · Keep the area around your vagina and rectum clean.  ? Wash the area daily with warm water.  ? Wipe yourself from front to back after using the toilet.  · Keep all follow-up visits as told by your health care provider. This is important.  How is this prevented?  · Do not   douche.  · Wash the outside of your vagina with warm water only.  · Use protection when having sex. This includes latex condoms and dental dams.  · Limit how many sexual partners you have. To help prevent bacterial vaginosis, it is best to have sex with just one partner (monogamous).  · Make sure you and your sexual partner are tested for STIs.  · Wear cotton or cotton-lined underwear.  · Avoid wearing tight pants and pantyhose, especially during summer.  · Limit the amount of alcohol that you drink.  · Do not use any products that contain  nicotine or tobacco, such as cigarettes and e-cigarettes. If you need help quitting, ask your health care provider.  · Do not use illegal drugs.  Where to find more information  · Centers for Disease Control and Prevention: www.cdc.gov/std  · American Sexual Health Association (ASHA): www.ashastd.org  · U.S. Department of Health and Human Services, Office on Women's Health: www.womenshealth.gov/ or https://www.womenshealth.gov/a-z-topics/bacterial-vaginosis  Contact a health care provider if:  · Your symptoms do not improve, even after treatment.  · You have more discharge or pain when urinating.  · You have a fever.  · You have pain in your abdomen.  · You have pain during sex.  · You have vaginal bleeding between periods.  Summary  · Bacterial vaginosis is a vaginal infection that occurs when the normal balance of bacteria in the vagina is disrupted.  · Because bacterial vaginosis increases your risk for STIs (sexually transmitted infections), getting treated can help reduce your risk for chlamydia, gonorrhea, herpes, and HIV (human immunodeficiency virus). Treatment is also important for preventing complications in pregnant women, because the condition can cause an early (premature) delivery.  · This condition is treated with antibiotic medicines. These may be given as a pill, a vaginal cream, or a medicine that is put into the vagina (suppository).  This information is not intended to replace advice given to you by your health care provider. Make sure you discuss any questions you have with your health care provider.  Document Released: 08/31/2005 Document Revised: 01/04/2017 Document Reviewed: 05/16/2016  Elsevier Interactive Patient Education © 2019 [employer] Inc.

## 2019-04-05 IMAGING — CT CT HEAD W/O CM
3 series · 15 of 47 positions shown, 18 images · non-contrast
Comparison: None.

CLINICAL DATA: 22-year-old female with history of head injury after
being struck by a ladder. Dizziness.

EXAM:
CT HEAD WITHOUT CONTRAST
TECHNIQUE: Contiguous axial images were obtained from the base of the skull
through the vertex without intravenous contrast.

[Series 2: head wo · axial · 0.47mm/px · z∈[-222,-87]mm · 9 of 33 slices shown, 12 images]
[im 3/33  brain]
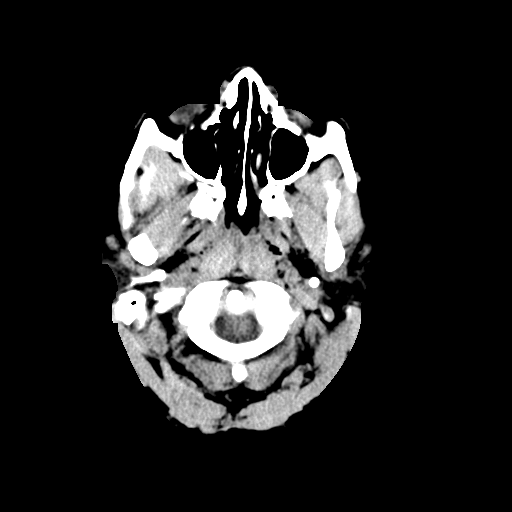
[im 3/33  bone]
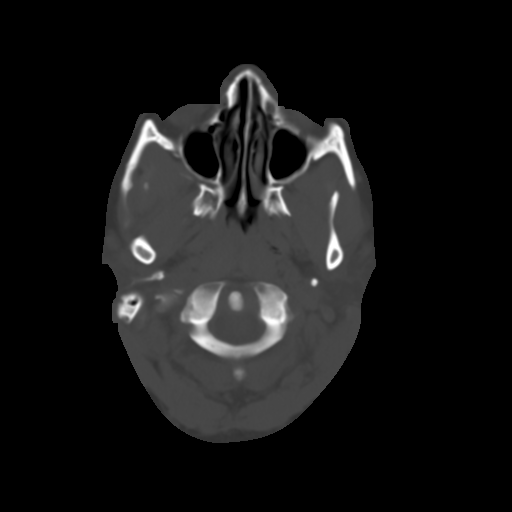
[im 6/33  brain]
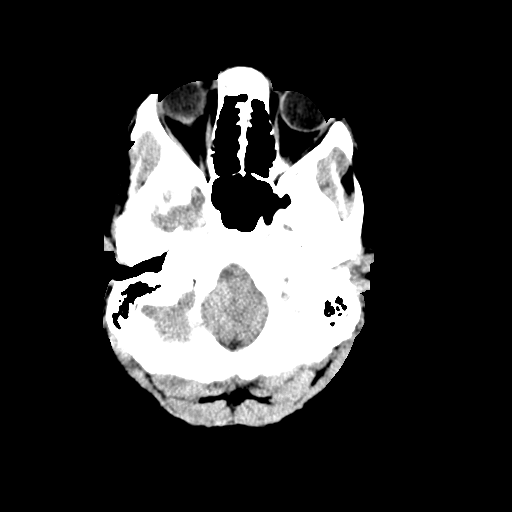
[im 9/33  brain]
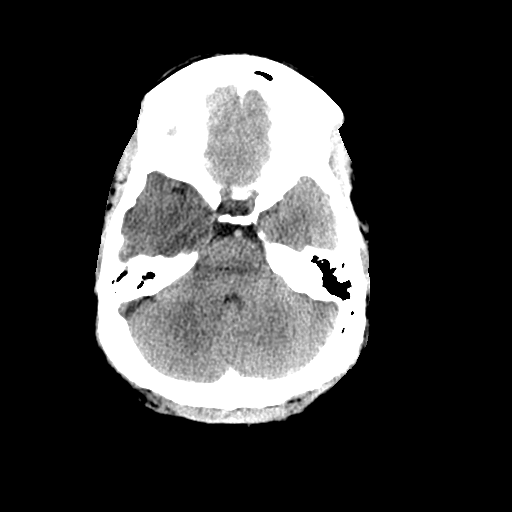
[im 13/33  brain]
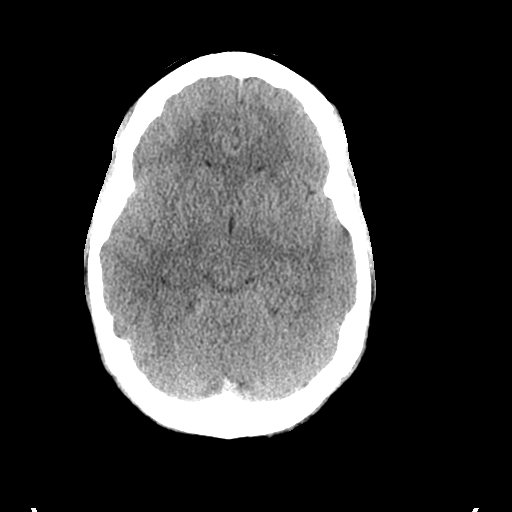
[im 17/33  brain]
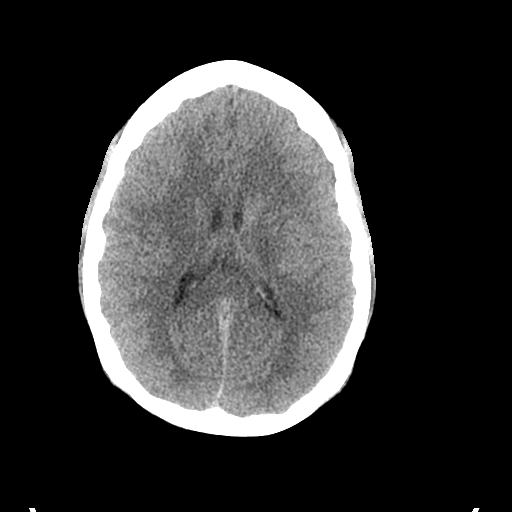
[im 17/33  bone]
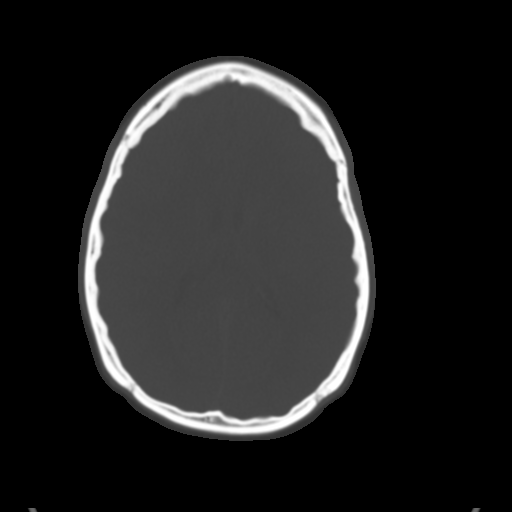
[im 20/33  brain]
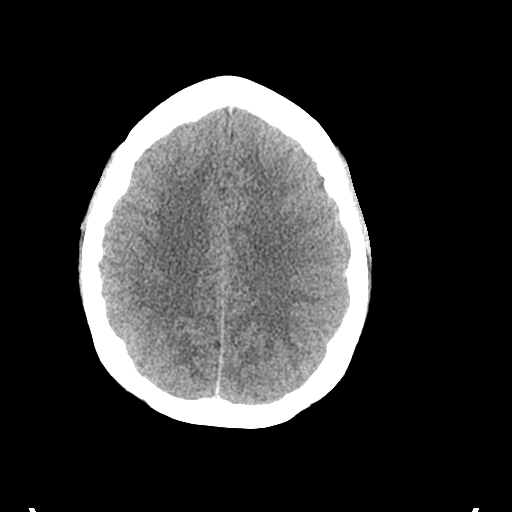
[im 24/33  brain]
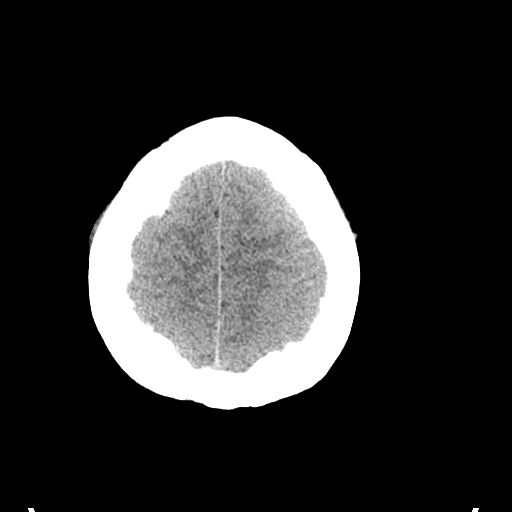
[im 27/33  brain]
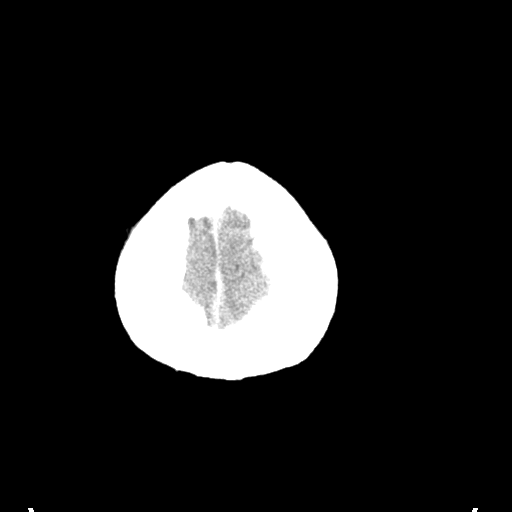
[im 30/33  brain]
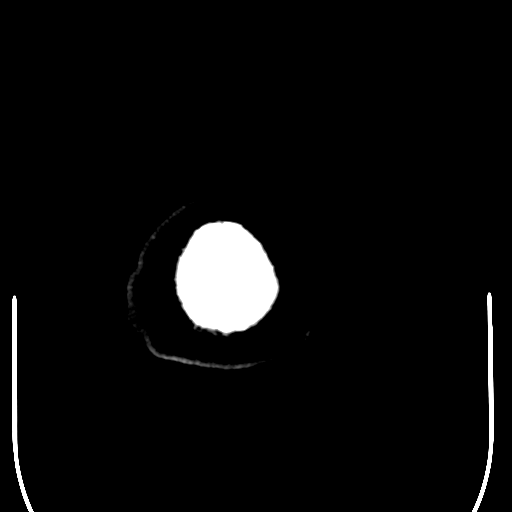
[im 30/33  bone]
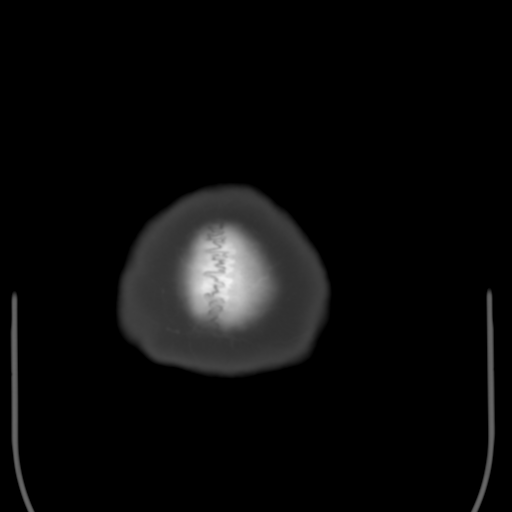

[Series 5: coronal soft tissue · coronal · 0.31mm/px · 3 of 82 slices shown]
[im 28/82  brain]
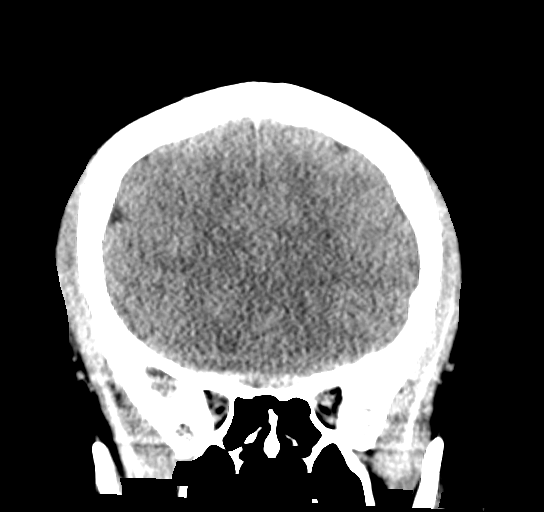
[im 37/82  brain]
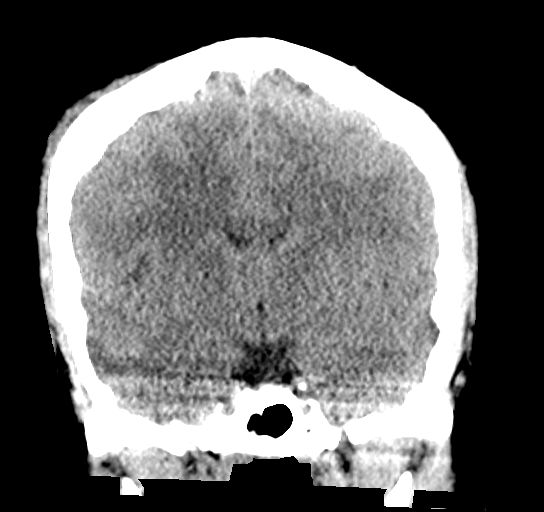
[im 46/82  brain]
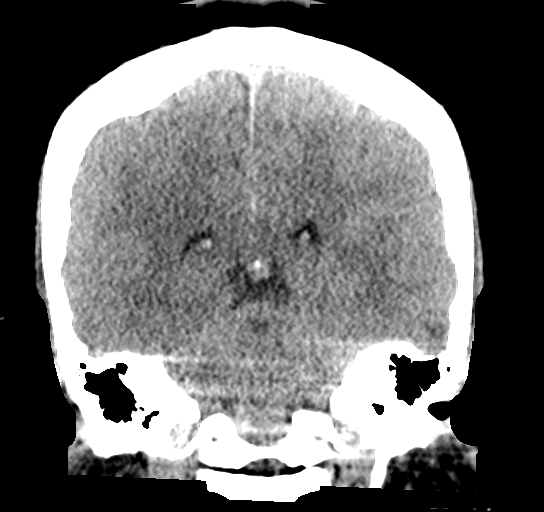

[Series 6: sagittal soft tissue · sagittal · 0.31mm/px · 3 of 68 slices shown]
[im 23/68  brain]
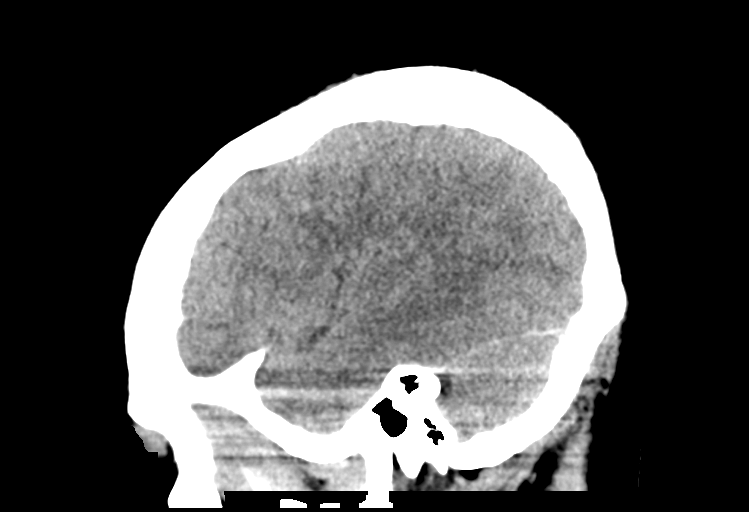
[im 34/68  brain]
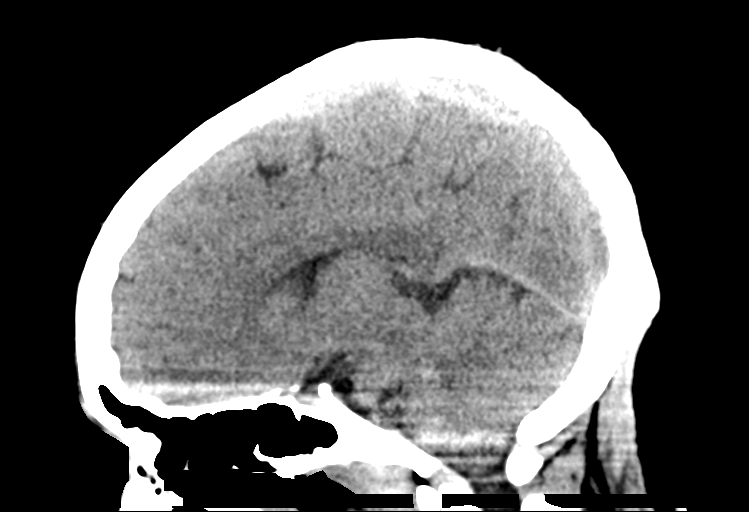
[im 45/68  brain]
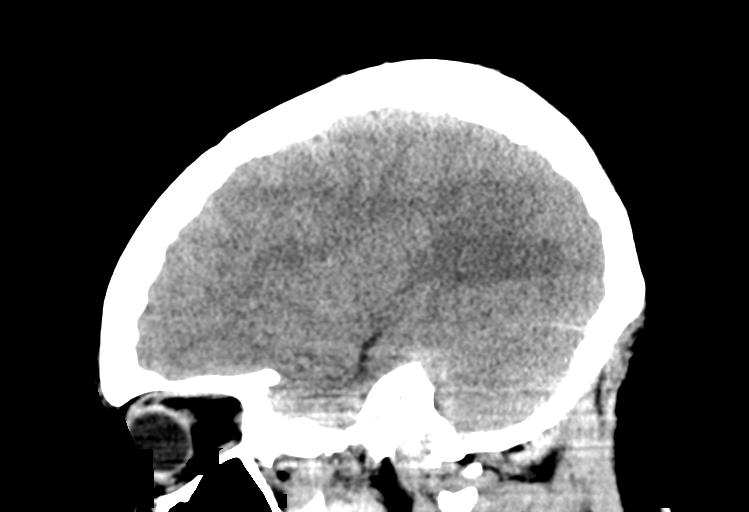

[15 of 47 positions shown; findings below may reference images not displayed]

FINDINGS: Brain: No evidence of acute infarction, hemorrhage, hydrocephalus,
extra-axial collection or mass lesion/mass effect.

Vascular: No hyperdense vessel or unexpected calcification.

Skull: Normal. Negative for fracture or focal lesion.

Sinuses/Orbits: No acute finding. Mucosal thickening in the lateral
aspect of the left sphenoid sinus.

Other: None.
IMPRESSION: 1. No acute intracranial abnormalities. The appearance of the brain
is normal.

## 2019-10-19 ENCOUNTER — Ambulatory Visit (INDEPENDENT_AMBULATORY_CARE_PROVIDER_SITE_OTHER): Payer: Managed Care, Other (non HMO) | Admitting: Family Medicine

## 2019-10-19 ENCOUNTER — Other Ambulatory Visit: Payer: Self-pay

## 2019-10-19 ENCOUNTER — Encounter: Payer: Self-pay | Admitting: Family Medicine

## 2019-10-19 VITALS — BP 121/74 | HR 54 | Ht 65.0 in | Wt 247.0 lb

## 2019-10-19 DIAGNOSIS — B9689 Other specified bacterial agents as the cause of diseases classified elsewhere: Secondary | ICD-10-CM | POA: Diagnosis not present

## 2019-10-19 DIAGNOSIS — N760 Acute vaginitis: Secondary | ICD-10-CM

## 2019-10-19 DIAGNOSIS — N926 Irregular menstruation, unspecified: Secondary | ICD-10-CM | POA: Diagnosis not present

## 2019-10-19 DIAGNOSIS — A6004 Herpesviral vulvovaginitis: Secondary | ICD-10-CM | POA: Diagnosis not present

## 2019-10-19 MED ORDER — VALACYCLOVIR HCL 1 G PO TABS: 1000.0000 mg | ORAL_TABLET | Freq: Every day | ORAL | 3 refills | Status: AC

## 2019-10-19 NOTE — Progress Notes (Signed)
   Subjective:    Patient ID: Brittany Mendoza, female    DOB: [birth year], 24 y.o.   MRN: [id number]  HPI Patient would like Pregnancy test today. Start her period a day late, lighter than usual, no cramping.   Has history of HSV. Had one outbreak a couple of years ago.  History of recurrent BV. No current symptoms.  I have reviewed the patients past medical, family, and social history.  I have reviewed the patient's medication list and allergies.  Review of Systems     Objective:   Physical Exam Constitutional:      Appearance: Normal appearance.  HENT:     Head: Normocephalic and atraumatic.  Cardiovascular:     Rate and Rhythm: Regular rhythm. Tachycardia present.     Pulses: Normal pulses.     Heart sounds: Normal heart sounds.  Pulmonary:     Breath sounds: Normal breath sounds.  Abdominal:     General: Abdomen is flat. There is no distension.     Palpations: Abdomen is soft.     Tenderness: There is no abdominal tenderness.  Skin:    General: Skin is warm and dry.     Capillary Refill: Capillary refill takes less than 2 seconds.  Neurological:     General: No focal deficit present.     Mental Status: She is alert and oriented to person, place, and time. Mental status is at baseline.  Psychiatric:        Mood and Affect: Mood normal.        Behavior: Behavior normal.        Judgment: Judgment normal.       Assessment & Plan:  1. Herpes simplex vulvovaginitis Discussed HSV, intermitent infection. Will send prescription for valtrex to pharmacy.  2. BV (bacterial vaginosis) No symptoms currently. Discussed triggers. Patient to come in for NV for self-swab as needed.  3. Late period Pregnancy test requested - negative - POCT urine pregnancy  F/u for annual exam.

## 2019-10-19 NOTE — Progress Notes (Signed)
Patient states she doesn't want a vaginal exam done today since she started her cycle. Patient would like to just talk with the doctor. Armandina Stammer RN

## 2019-12-15 ENCOUNTER — Encounter (HOSPITAL_COMMUNITY): Payer: Self-pay | Admitting: Family Medicine

## 2019-12-15 ENCOUNTER — Other Ambulatory Visit: Payer: Self-pay

## 2019-12-15 ENCOUNTER — Ambulatory Visit (HOSPITAL_COMMUNITY)
Admission: EM | Admit: 2019-12-15 | Discharge: 2019-12-15 | Disposition: A | Discharge status: Home / Self Care | Payer: 59 | Attending: Family Medicine | Admitting: Family Medicine

## 2019-12-15 DIAGNOSIS — Z3202 Encounter for pregnancy test, result negative: Secondary | ICD-10-CM

## 2019-12-15 DIAGNOSIS — R8281 Pyuria: Secondary | ICD-10-CM | POA: Insufficient documentation

## 2019-12-15 LAB — POCT URINALYSIS DIP (DEVICE)
Bilirubin Urine: NEGATIVE
Glucose, UA: NEGATIVE mg/dL
Hgb urine dipstick: NEGATIVE
Ketones, ur: NEGATIVE mg/dL
Nitrite: NEGATIVE
Protein, ur: NEGATIVE mg/dL
Specific Gravity, Urine: 1.02 (ref 1.005–1.030)
Urobilinogen, UA: 0.2 mg/dL (ref 0.0–1.0)
pH: 6 (ref 5.0–8.0)

## 2019-12-15 LAB — POC URINE PREG, ED: Preg Test, Ur: NEGATIVE

## 2019-12-15 LAB — POCT PREGNANCY, URINE: Preg Test, Ur: NEGATIVE

## 2019-12-15 MED ORDER — CEPHALEXIN 500 MG PO CAPS: 500.0000 mg | ORAL_CAPSULE | Freq: Three times a day (TID) | ORAL | 0 refills | Status: AC

## 2019-12-15 NOTE — Discharge Instructions (Addendum)
A urine culture is pending.  We'll call you if you need to change therapy

## 2019-12-15 NOTE — ED Provider Notes (Signed)
[facility]    CSN: [id number] Arrival date & time: 12/15/19  0847      History   Chief Complaint Chief Complaint  Patient presents with  . Abdominal Pain    HPI Brittany Mendoza is a 24 y.o. female.   Established [facility] patient  Patient is here for pregnancy testing.  LMP was November 14, 2019 making [facility] 08/22/2020.  Patient is having some lower abdominal pressure and morning nausea.     Past Medical History:  Diagnosis Date  . Dysmenorrhea   . Seasonal allergies   . STD (sexually transmitted disease)     There are no problems to display for this patient.   History reviewed. No pertinent surgical history.  OB History    Gravida  0   Para  0   Term  0   Preterm  0   AB  0   Living  0     SAB  0   TAB  0   Ectopic  0   Multiple  0   Live Births  0            Home Medications    Prior to Admission medications   Medication Sig Start Date End Date Taking? Authorizing Provider  cephALEXin (KEFLEX) 500 MG capsule Take 1 capsule (500 mg total) by mouth 3 (three) times daily. 12/15/19   Elvina Sidle, MD  fexofenadine-pseudoephedrine (ALLEGRA-D 24) 180-240 MG 24 hr tablet Take 1 tablet by mouth daily.    [provider]  omeprazole (PRILOSEC) 10 MG capsule Take 10 mg by mouth daily.    [provider]  valACYclovir (VALTREX) 1000 MG tablet Take 1 tablet (1,000 mg total) by mouth daily. Take for 5 days 10/19/19   Levie Heritage, DO    Family History Family History  Problem Relation Age of Onset  . Hypertension Mother   . Thyroid disease Mother   . Diabetes Father   . Hypertension Father   . Bone cancer Maternal Grandmother   . Bone cancer Maternal Grandfather     Social History Social History   Tobacco Use  . Smoking status: Never Smoker  . Smokeless tobacco: Never Used  Substance Use Topics  . Alcohol use: Yes    Alcohol/week: 3.0 standard drinks    Types: 3 Standard drinks or equivalent per week  . Drug  use: Never     Allergies   Patient has no known allergies.   Review of Systems Review of Systems   Physical Exam Triage Vital Signs ED Triage Vitals  Enc Vitals Group     BP      Pulse      Resp      Temp      Temp src      SpO2      Weight      Height      Head Circumference      Peak Flow      Pain Score      Pain Loc      Pain Edu?      Excl. in GC?    No data found.  Updated Vital Signs BP 114/65 (BP Location: Right Arm)   Pulse 74   Temp 98.7 F (37.1 C) (Oral)   Resp 16   LMP 11/14/2019 (Exact Date)   SpO2 100%    Physical Exam Vitals and nursing note reviewed.  Constitutional:      Appearance: She is well-developed. She is  obese.  HENT:     Head: Normocephalic.  Pulmonary:     Effort: Pulmonary effort is normal.  Skin:    General: Skin is warm.  Neurological:     General: No focal deficit present.     Mental Status: She is alert.  Psychiatric:        Mood and Affect: Mood normal.        Behavior: Behavior normal.      UC Treatments / Results  Labs (all labs ordered are listed, but only abnormal results are displayed) Labs Reviewed  POCT URINALYSIS DIP (DEVICE) - Abnormal; Notable for the following components:      Result Value   Leukocytes,Ua SMALL (*)    All other components within normal limits  URINE CULTURE  POC URINE PREG, ED  POCT PREGNANCY, URINE    EKG   Radiology No results found.  Procedures Procedures (including critical care time)  Medications Ordered in UC Medications - No data to display  Initial Impression / Assessment and Plan / UC Course  I have reviewed the triage vital signs and the nursing notes.  Pertinent labs & imaging results that were available during my care of the patient were reviewed by me and considered in my medical decision making (see chart for details).    Final Clinical Impressions(s) / UC Diagnoses   Final diagnoses:  Pyuria     Discharge Instructions     A urine culture  is pending.  We'll call you if you need to change therapy    ED Prescriptions    Medication Sig Dispense Auth. Provider   cephALEXin (KEFLEX) 500 MG capsule Take 1 capsule (500 mg total) by mouth 3 (three) times daily. 15 capsule Robyn Haber, MD     I have reviewed the PDMP during this encounter.   Robyn Haber, MD 12/15/19 [phone]

## 2019-12-15 NOTE — ED Triage Notes (Signed)
C/o lower abdominal pain and morning sickness. LMP 11/14/2019.

## 2019-12-16 LAB — URINE CULTURE: Culture: <10000 — AB

## 2020-04-18 ENCOUNTER — Other Ambulatory Visit: Payer: Self-pay

## 2020-04-18 ENCOUNTER — Ambulatory Visit (HOSPITAL_COMMUNITY)
Admission: EM | Admit: 2020-04-18 | Discharge: 2020-04-18 | Disposition: A | Discharge status: Home / Self Care | Payer: 59 | Attending: Family Medicine | Admitting: Family Medicine

## 2020-04-18 ENCOUNTER — Encounter (HOSPITAL_COMMUNITY): Payer: Self-pay

## 2020-04-18 DIAGNOSIS — N760 Acute vaginitis: Secondary | ICD-10-CM | POA: Insufficient documentation

## 2020-04-18 DIAGNOSIS — N898 Other specified noninflammatory disorders of vagina: Secondary | ICD-10-CM

## 2020-04-18 DIAGNOSIS — Z3202 Encounter for pregnancy test, result negative: Secondary | ICD-10-CM | POA: Diagnosis not present

## 2020-04-18 DIAGNOSIS — Z79899 Other long term (current) drug therapy: Secondary | ICD-10-CM | POA: Diagnosis not present

## 2020-04-18 LAB — POCT URINALYSIS DIPSTICK, ED / UC
Bilirubin Urine: NEGATIVE
Glucose, UA: NEGATIVE mg/dL
Hgb urine dipstick: NEGATIVE
Ketones, ur: NEGATIVE mg/dL
Nitrite: NEGATIVE
Protein, ur: NEGATIVE mg/dL
Specific Gravity, Urine: 1.025 (ref 1.005–1.030)
Urobilinogen, UA: 0.2 mg/dL (ref 0.0–1.0)
pH: 5.5 (ref 5.0–8.0)

## 2020-04-18 LAB — POC URINE PREG, ED: Preg Test, Ur: NEGATIVE

## 2020-04-18 MED ORDER — FLUCONAZOLE 200 MG PO TABS: 200.0000 mg | ORAL_TABLET | Freq: Once | ORAL | 0 refills | Status: AC

## 2020-04-18 NOTE — Discharge Instructions (Signed)
May use Boric Acid capsules for 1 month, then twice a week as needed for yeast or BV  Pregnancy test was negative.  Your vaginal tests are pending.  If your test results are positive, we will call you.  You may need additional treatment and your partner(s) may also need treatment.

## 2020-04-18 NOTE — ED Triage Notes (Signed)
Pt presents with vaginal discharge and abnormal spotting X 1 week.

## 2020-04-18 NOTE — ED Provider Notes (Signed)
[facility]   [id number] 04/18/20 Arrival Time: 0959   CC: VAGINAL DISCHARGE  SUBJECTIVE:  Brittany Mendoza is a 24 y.o. female who presents with complaints of gradual vaginal discharge that began 7 days ago. Reports that the discharge is white and there is more of it than normal.  She denies a precipitating event, recent sexual encounter or recent antibiotic use.  Patient is sexually active.  Has not taken OTC medications for this. Has hx BV and yeast. She denies fever, chills, nausea, vomiting, abdominal or pelvic pain, urinary symptoms, vaginal itching, vaginal odor, vaginal bleeding, dyspareunia, vaginal rashes or lesions.   Patient's last menstrual period was 04/07/2020.   ROS: As per HPI.  All other pertinent ROS negative.     Past Medical History:  Diagnosis Date  . Dysmenorrhea   . Seasonal allergies   . STD (sexually transmitted disease)    History reviewed. No pertinent surgical history. No Known Allergies No current facility-administered medications on file prior to encounter.   Current Outpatient Medications on File Prior to Encounter  Medication Sig Dispense Refill  . cephALEXin (KEFLEX) 500 MG capsule Take 1 capsule (500 mg total) by mouth 3 (three) times daily. 15 capsule 0  . fexofenadine-pseudoephedrine (ALLEGRA-D 24) 180-240 MG 24 hr tablet Take 1 tablet by mouth daily.    Marland Kitchen omeprazole (PRILOSEC) 10 MG capsule Take 10 mg by mouth daily.    . valACYclovir (VALTREX) 1000 MG tablet Take 1 tablet (1,000 mg total) by mouth daily. Take for 5 days 5 tablet 3    Social History   Socioeconomic History  . Marital status: Significant Other    Spouse name: Not on file  . Number of children: Not on file  . Years of education: Not on file  . Highest education level: Not on file  Occupational History  . Not on file  Tobacco Use  . Smoking status: Never Smoker  . Smokeless tobacco: Never Used  Vaping Use  . Vaping Use: Never used  Substance and Sexual  Activity  . Alcohol use: Yes    Alcohol/week: 3.0 standard drinks    Types: 3 Standard drinks or equivalent per week  . Drug use: Never  . Sexual activity: Yes    Birth control/protection: None  Other Topics Concern  . Not on file  Social History Narrative  . Not on file   Social Determinants of Health   Financial Resource Strain:   . Difficulty of Paying Living Expenses:   Food Insecurity:   . Worried About Programme researcher, broadcasting/film/video in the Last Year:   . Barista in the Last Year:   Transportation Needs:   . Freight forwarder (Medical):   Marland Kitchen Lack of Transportation (Non-Medical):   Physical Activity:   . Days of Exercise per Week:   . Minutes of Exercise per Session:   Stress:   . Feeling of Stress :   Social Connections:   . Frequency of Communication with Friends and Family:   . Frequency of Social Gatherings with Friends and Family:   . Attends Religious Services:   . Active Member of Clubs or Organizations:   . Attends Banker Meetings:   Marland Kitchen Marital Status:   Intimate Partner Violence:   . Fear of Current or Ex-Partner:   . Emotionally Abused:   Marland Kitchen Physically Abused:   . Sexually Abused:    Family History  Problem Relation Age of Onset  . Hypertension Mother   .  Thyroid disease Mother   . Diabetes Father   . Hypertension Father   . Bone cancer Maternal Grandmother   . Bone cancer Maternal Grandfather     OBJECTIVE:  Vitals:   04/18/20 1049  BP: 125/74  Pulse: (!) 59  Resp: 18  Temp: 98.3 F (36.8 C)  TempSrc: Oral  SpO2: 100%     General appearance: Alert, NAD, appears stated age Head: NCAT Throat: lips, mucosa, and tongue normal; teeth and gums normal Lungs: CTA bilaterally without adventitious breath sounds Heart: regular rate and rhythm.  Radial pulses 2+ symmetrical bilaterally Back: no CVA tenderness Abdomen: soft, non-tender; bowel sounds normal; no masses or organomegaly; no guarding or rebound tenderness GU:  declines Skin: warm and dry Psychological:  Alert and cooperative. Normal mood and affect.  LABS:  Results for orders placed or performed during the hospital encounter of 04/18/20  POC urine pregnancy  Result Value Ref Range   Preg Test, Ur NEGATIVE NEGATIVE  POC Urinalysis dipstick  Result Value Ref Range   Glucose, UA NEGATIVE NEGATIVE mg/dL   Bilirubin Urine NEGATIVE NEGATIVE   Ketones, ur NEGATIVE NEGATIVE mg/dL   Specific Gravity, Urine 1.025 1.005 - 1.030   Hgb urine dipstick NEGATIVE NEGATIVE   pH 5.5 5.0 - 8.0   Protein, ur NEGATIVE NEGATIVE mg/dL   Urobilinogen, UA 0.2 0.0 - 1.0 mg/dL   Nitrite NEGATIVE NEGATIVE   Leukocytes,Ua TRACE (A) NEGATIVE    Labs Reviewed  POCT URINALYSIS DIPSTICK, ED / UC - Abnormal; Notable for the following components:      Result Value   Leukocytes,Ua TRACE (*)    All other components within normal limits  URINE CULTURE  POC URINE PREG, ED  CERVICOVAGINAL ANCILLARY ONLY    ASSESSMENT & PLAN:  1. Vaginal discharge   2. Acute vaginitis   3. Negative pregnancy test     Meds ordered this encounter  Medications  . fluconazole (DIFLUCAN) 200 MG tablet    Sig: Take 1 tablet (200 mg total) by mouth once for 1 dose.    Dispense:  2 tablet    Refill:  0    Order Specific Question:   Supervising Provider    Answer:   Merrilee Jansky [[id number]]    Pending: Labs Reviewed  POCT URINALYSIS DIPSTICK, ED / UC - Abnormal; Notable for the following components:      Result Value   Leukocytes,Ua TRACE (*)    All other components within normal limits  URINE CULTURE  POC URINE PREG, ED  CERVICOVAGINAL ANCILLARY ONLY    May try Boric Acid vaginally daily x 1 month, then twice weekly afterward Vaginal self-swab obtained.   We will follow up with you regarding abnormal results Declines HIV/ syphilis testing today Prescribed diflucan 200 mg once daily and then second dose 72 hours later Take medications as prescribed and to completion If  tests results are positive, please abstain from sexual activity until you and your partner(s) have been treated Follow up with PCP or [facility] if symptoms persists Return here or go to ER if you have any new or worsening symptoms fever, chills, nausea, vomiting, abdominal or pelvic pain, painful intercourse, vaginal discharge, vaginal bleeding, persistent symptoms despite treatment Reviewed expectations re: course of current medical issues. Questions answered. Outlined signs and symptoms indicating need for more acute intervention. Patient verbalized understanding. After Visit Summary given.       Moshe Cipro, NP 04/18/20 [phone]

## 2020-04-19 ENCOUNTER — Telehealth (HOSPITAL_COMMUNITY): Payer: Self-pay

## 2020-04-19 LAB — URINE CULTURE: Culture: <10000 — AB

## 2020-04-19 LAB — CERVICOVAGINAL ANCILLARY ONLY
Bacterial Vaginitis (gardnerella): POSITIVE — AB
Candida Glabrata: NEGATIVE
Candida Vaginitis: POSITIVE — AB
Chlamydia: NEGATIVE
Comment: NEGATIVE
Comment: NEGATIVE
Comment: NEGATIVE
Comment: NEGATIVE
Comment: NEGATIVE
Comment: NORMAL
Neisseria Gonorrhea: NEGATIVE
Trichomonas: POSITIVE — AB

## 2020-04-19 MED ORDER — METRONIDAZOLE 500 MG PO TABS: 500.0000 mg | ORAL_TABLET | Freq: Two times a day (BID) | ORAL | 0 refills | Status: AC

## 2020-06-13 ENCOUNTER — Ambulatory Visit (HOSPITAL_COMMUNITY)
Admission: EM | Admit: 2020-06-13 | Discharge: 2020-06-13 | Disposition: A | Discharge status: Home / Self Care | Payer: 59 | Attending: Family Medicine | Admitting: Family Medicine

## 2020-06-13 ENCOUNTER — Other Ambulatory Visit: Payer: Self-pay

## 2020-06-13 ENCOUNTER — Encounter (HOSPITAL_COMMUNITY): Payer: Self-pay

## 2020-06-13 DIAGNOSIS — N926 Irregular menstruation, unspecified: Secondary | ICD-10-CM | POA: Diagnosis not present

## 2020-06-13 DIAGNOSIS — R112 Nausea with vomiting, unspecified: Secondary | ICD-10-CM | POA: Diagnosis not present

## 2020-06-13 DIAGNOSIS — Z3202 Encounter for pregnancy test, result negative: Secondary | ICD-10-CM

## 2020-06-13 LAB — HCG, QUANTITATIVE, PREGNANCY: hCG, Beta Chain, Quant, S: 1 m[IU]/mL (ref <5)

## 2020-06-13 LAB — POC URINE PREG, ED: Preg Test, Ur: NEGATIVE

## 2020-06-13 NOTE — ED Triage Notes (Signed)
Pt presents for pregnancy test. States her period is delayed by 3 days and she is having nausea and vomiting. States she is hungry, and she can only tolerate fries, potatoes soup and bread.

## 2020-06-13 NOTE — Discharge Instructions (Signed)
You can take B6 vitamin and doxylamine together to create a similar medication to Diclegis which is a pregnancy safe nausea medication JUST IN CASE you are to become pregnant. Follow up with [facility] if your symptoms do not improve or if you do become pregnant.  [facility] [address] [place], [place] [address] Phone: [phone]

## 2020-06-13 NOTE — ED Provider Notes (Signed)
[facility]    CSN: [id number] Arrival date & time: 06/13/20  7106      History   Chief Complaint Chief Complaint  Patient presents with  . Possible Pregnancy  . Emesis    HPI Brittany Mendoza is a 24 y.o. female.   Patient presenting today for missed period and some abnormal symptoms which she thinks may be attributed to pregnancy. States her period is about 4 days late now (typically very regular) and she's having aversions to certain foods and N/V with certain foods. Denies fever, chills, diarrhea, urinary sxs, URI sxs, sick contacts, new foods or medications. Denies concern for STIs. Not on contraception and is having unprotected intercourse. 3 home pregnancy tests taken so far, one negative and the other two inconclusive.      Past Medical History:  Diagnosis Date  . Dysmenorrhea   . Seasonal allergies   . STD (sexually transmitted disease)     There are no problems to display for this patient.   History reviewed. No pertinent surgical history.  OB History    Gravida  0   Para  0   Term  0   Preterm  0   AB  0   Living  0     SAB  0   TAB  0   Ectopic  0   Multiple  0   Live Births  0            Home Medications    Prior to Admission medications   Medication Sig Start Date End Date Taking? Authorizing Provider  cephALEXin (KEFLEX) 500 MG capsule Take 1 capsule (500 mg total) by mouth 3 (three) times daily. 12/15/19   Elvina Sidle, MD  fexofenadine-pseudoephedrine (ALLEGRA-D 24) 180-240 MG 24 hr tablet Take 1 tablet by mouth daily.    [provider]  metroNIDAZOLE (FLAGYL) 500 MG tablet Take 1 tablet (500 mg total) by mouth 2 (two) times daily. 04/19/20   Merrilee Jansky, MD  omeprazole (PRILOSEC) 10 MG capsule Take 10 mg by mouth daily.    [provider]  valACYclovir (VALTREX) 1000 MG tablet Take 1 tablet (1,000 mg total) by mouth daily. Take for 5 days 10/19/19   Levie Heritage, DO    Family  History Family History  Problem Relation Age of Onset  . Hypertension Mother   . Thyroid disease Mother   . Diabetes Father   . Hypertension Father   . Bone cancer Maternal Grandmother   . Bone cancer Maternal Grandfather     Social History Social History   Tobacco Use  . Smoking status: Never Smoker  . Smokeless tobacco: Never Used  Vaping Use  . Vaping Use: Never used  Substance Use Topics  . Alcohol use: Yes    Alcohol/week: 3.0 standard drinks    Types: 3 Standard drinks or equivalent per week  . Drug use: Never     Allergies   Patient has no known allergies.   Review of Systems Review of Systems PER HPI   Physical Exam Triage Vital Signs ED Triage Vitals  Enc Vitals Group     BP 06/13/20 0900 131/81     Pulse Rate 06/13/20 0900 67     Resp 06/13/20 0900 18     Temp 06/13/20 0900 98.6 F (37 C)     Temp Source 06/13/20 0900 Oral     SpO2 06/13/20 0900 100 %     Weight --  Height --      Head Circumference --      Peak Flow --      Pain Score 06/13/20 0858 0     Pain Loc --      Pain Edu? --      Excl. in GC? --    No data found.  Updated Vital Signs BP 131/81 (BP Location: Left Arm)   Pulse 67   Temp 98.6 F (37 C) (Oral)   Resp 18   LMP  (Within Months) Comment: 10month  SpO2 100%   Visual Acuity Right Eye Distance:   Left Eye Distance:   Bilateral Distance:    Right Eye Near:   Left Eye Near:    Bilateral Near:     Physical Exam Vitals and nursing note reviewed.  Constitutional:      Appearance: Normal appearance. She is not ill-appearing.  HENT:     Head: Atraumatic.     Right Ear: Tympanic membrane normal.     Left Ear: Tympanic membrane normal.     Nose: Nose normal.     Mouth/Throat:     Mouth: Mucous membranes are moist.     Pharynx: Oropharynx is clear.  Eyes:     Extraocular Movements: Extraocular movements intact.     Conjunctiva/sclera: Conjunctivae normal.  Cardiovascular:     Rate and Rhythm: Normal rate  and regular rhythm.     Heart sounds: Normal heart sounds.  Pulmonary:     Effort: Pulmonary effort is normal.     Breath sounds: Normal breath sounds. No wheezing or rales.  Abdominal:     General: Bowel sounds are normal. There is no distension.     Palpations: Abdomen is soft.     Tenderness: There is no abdominal tenderness. There is no right CVA tenderness, left CVA tenderness or guarding.  Musculoskeletal:        General: Normal range of motion.     Cervical back: Normal range of motion and neck supple.  Skin:    General: Skin is warm and dry.  Neurological:     Mental Status: She is alert and oriented to person, place, and time.  Psychiatric:        Mood and Affect: Mood normal.        Thought Content: Thought content normal.        Judgment: Judgment normal.     UC Treatments / Results  Labs (all labs ordered are listed, but only abnormal results are displayed) Labs Reviewed  HCG, QUANTITATIVE, PREGNANCY  POC URINE PREG, ED    EKG   Radiology No results found.  Procedures Procedures (including critical care time)  Medications Ordered in UC Medications - No data to display  Initial Impression / Assessment and Plan / UC Course  I have reviewed the triage vital signs and the nursing notes.  Pertinent labs & imaging results that were available during my care of the patient were reviewed by me and considered in my medical decision making (see chart for details).     Urine preg negative today, exam and vitals reassuring, declines COVID testing or STI testing today. She is requesting beta hcg levels for further confirmation of pregnancy status which is pending. Discussed diclegis as an option for pregnancy safe symptomatic control in case she does become pregnant. Information given for [facility] clinic in case sxs worsen or do not improve. Return precautions reviewed.   Final Clinical Impressions(s) / UC Diagnoses   Final diagnoses:  Missed periods   Non-intractable vomiting with nausea, unspecified vomiting type     Discharge Instructions     You can take B6 vitamin and doxylamine together to create a similar medication to Diclegis which is a pregnancy safe nausea medication JUST IN CASE you are to become pregnant. Follow up with [facility] if your symptoms do not improve or if you do become pregnant.  [facility] [address] [place], [place] [address] Phone: [phone]    ED Prescriptions    None     PDMP not reviewed this encounter.   Particia Nearing, [place] 06/13/20 1121

## 2021-05-13 ENCOUNTER — Ambulatory Visit (INDEPENDENT_AMBULATORY_CARE_PROVIDER_SITE_OTHER): Payer: Managed Care, Other (non HMO) | Admitting: Family Medicine

## 2021-05-13 ENCOUNTER — Encounter (INDEPENDENT_AMBULATORY_CARE_PROVIDER_SITE_OTHER): Payer: Managed Care, Other (non HMO)

## 2021-05-13 ENCOUNTER — Other Ambulatory Visit: Payer: Self-pay

## 2021-05-13 ENCOUNTER — Encounter (INDEPENDENT_AMBULATORY_CARE_PROVIDER_SITE_OTHER): Payer: Self-pay | Admitting: Family Medicine

## 2021-05-13 VITALS — BP 133/86 | HR 72 | Temp 98.6°F

## 2021-05-13 DIAGNOSIS — S93402A Sprain of unspecified ligament of left ankle, initial encounter: Secondary | ICD-10-CM

## 2021-05-13 DIAGNOSIS — S99912A Unspecified injury of left ankle, initial encounter: Secondary | ICD-10-CM

## 2021-05-13 DIAGNOSIS — W19XXXA Unspecified fall, initial encounter: Secondary | ICD-10-CM

## 2021-05-13 DIAGNOSIS — Y92511 Restaurant or cafe as the place of occurrence of the external cause: Secondary | ICD-10-CM

## 2021-05-13 DIAGNOSIS — R609 Edema, unspecified: Secondary | ICD-10-CM

## 2021-05-13 DIAGNOSIS — W010XXA Fall on same level from slipping, tripping and stumbling without subsequent striking against object, initial encounter: Secondary | ICD-10-CM

## 2021-05-13 NOTE — Nursing Note (Signed)
BP 133/86   Pulse 72   Temp 37 C (98.6 F) (Tympanic)   SpO2 98%     Leola Brazil, RN  05/13/2021, 13:19

## 2021-05-13 NOTE — Progress Notes (Signed)
[address], [place]  [address]  [place] [address]  Office Visit    ID: Malaisha Silliman   DOB: Oct 22, 1995  Date of Service: 05/13/2021     Chief Complaint(s):   Chief Complaint   Patient presents with   . Ankle Injury     left       SUBJECTIVE:  [name] comes in today with left ankle pain for about 1 hour PTA.  States she slipped at [facility] and her ankle rolled.   Has not been bearing weight.  Pain to the lateral ankle.  Never did this before.  Has used some ice.     ROS:  Constitutional: Denies fevers, chills, night sweats. No recent weight changes or fatigue.   Eyes: Denies change in vision. No irritation, erythema, discharge or pain.   Ears: Denies any difficulty with hearing. No ear pain, tinnitus or discharge.   Mouth/Throat: Denies any oral ulcers or other lesions. No sore throat, hoarseness or dysphagia.  Cardiovascular: Denies any chest pain, palpitations, PND or DOE  Respiratory: Denies any shortness of breath, wheezing, cough   GI: Denies any abdominal pain, nausea, vomiting, diarrhea or constipation. No melena or hematochezia.  GU: Denies any dysuria, frequency, hematuria, hesitancy. Denies nocturia or incontinence.  Musculoskeletal: left ankle pain  Skin: Denies any recent rashes or lesions.     Past Medical History:  Patient Active Problem List    Diagnosis Date Noted   . Stroke-like symptoms 12/23/2016   . Headache 12/23/2016   . Dizziness 12/23/2016       Medications:  Current Outpatient Medications   Medication Sig   . diazePAM (VALIUM) 5 mg Oral Tablet Take 1 Tab (5 mg total) by mouth Every 6 hours as needed (muscle spasm) (Patient not taking: Reported on 05/13/2021)   . diclofenac sodium (VOLTAREN) 75 mg Oral Tablet, Delayed Release (E.C.) Take 1 Tab (75 mg total) by mouth Twice daily (Patient not taking: Reported on 05/13/2021)   . etonogestrel (NEXPLANON SDRM) by Subdermal route   . medroxyPROGESTERone (DEPO-PROVERA) 150 mg/mL IntraMUSCULAR Suspension INJECT 1 ML (150 MG TOTAL) INTO  THE MUSCLE EVERY 3 (THREE) MONTHS   . predniSONE (DELTASONE) 50 mg Oral Tablet Take 1 Tab (50 mg total) by mouth Once a day (Patient not taking: Reported on 05/13/2021)   . SUMAtriptan (IMITREX) 6 mg/0.5 mL Subcutaneous Solution Inject 6 mg under the skin One time        Allergies:  No Known Allergies     Social History:  Social History     Tobacco Use   . Smoking status: Never Smoker   . Smokeless tobacco: Never Used   Substance Use Topics   . Alcohol use: Yes     Comment: rarely   . Drug use: No          OBJECTIVE:  BP 133/86   Pulse 72   Temp 37 C (98.6 F) (Tympanic)   SpO2 98%          Physical Exam:  General: No apparent acute distress. Very pleasant.   Eyes: Conjunctiva are clear. No discharge or icterus noted.  HENT: Mucous membranes are moist. Nares are clear.   Lungs: Clear to auscultation bilaterally. Good air movement. No wheezes or rhonchi.    Cardiovascular: Normal rate and regular rhythm. No murmurs, rubs or gallops.  Abdomen: Soft. BS present. Non-tender. No rebound, guarding, or peritoneal signs.   MSK:  Mild swelling to the ATF ligament on the left.  FROM of the ankle.    Neurologic: in wheelchair.     Psychiatric: Alert and oriented x 3. Affect within normal limits.    ASSESSMENT and PLAN:  1. Moderate left ankle sprain, initial encounter -  Xray negative for fracture or dislocation, likely sprain, RICE.  ACE wrap applied.  Order for crutches given.  follow-up with PCP.   2. Blima Ledger, DO 05/13/2021, 13:57        Visit Diagnoses and associated Orders -- Summary:        ICD-10-CM    1. Moderate left ankle sprain, initial encounter  S93.402A DME - UC CRUTCHES    2. Fall  W19.XXXA XR ANKLE LEFT         Portions of this note may be dictated using voice recognition software.   Variances in spelling and vocabulary are possible and unintentional. Not all errors are caught/corrected. Please notify the [facility] if any discrepancies are noted or if the meaning of any statement is  not clear.

## 2021-06-11 ENCOUNTER — Other Ambulatory Visit: Payer: Self-pay

## 2021-06-11 ENCOUNTER — Other Ambulatory Visit: Payer: Managed Care, Other (non HMO) | Attending: Family

## 2021-06-11 ENCOUNTER — Other Ambulatory Visit (HOSPITAL_COMMUNITY): Payer: Managed Care, Other (non HMO)

## 2021-06-11 ENCOUNTER — Ambulatory Visit (INDEPENDENT_AMBULATORY_CARE_PROVIDER_SITE_OTHER): Payer: Managed Care, Other (non HMO) | Admitting: Family

## 2021-06-11 ENCOUNTER — Encounter (INDEPENDENT_AMBULATORY_CARE_PROVIDER_SITE_OTHER): Payer: Self-pay | Admitting: Family

## 2021-06-11 VITALS — BP 126/70 | HR 76 | Temp 98.4°F | Ht 65.0 in | Wt 240.0 lb

## 2021-06-11 DIAGNOSIS — R350 Frequency of micturition: Secondary | ICD-10-CM | POA: Insufficient documentation

## 2021-06-11 NOTE — Progress Notes (Signed)
[address] [place]  [address]  [place] [address]       Name: [name] MRN:  [id number]   Date: 06/11/2021 Age: 25 y.o.; July 10, 1996      Chief Complaint(s):   Chief Complaint   Patient presents with   . Frequent Urination       SUBJECTIVE:  [name] is a 25 y.o. female who presents today with concerns for urinary frequency and bladder pressure for 5 days.     History of seasonal allergies.    Using probiotics    ROS:   Constitutional: Denies fevers, chills, weight changes, or fatigue  Cardiovascular: Denies chest pain or palpitations  Respiratory: Denies shortness of breath, wheezing, or cough   GI: Denies abdominal pain, nausea, vomiting, diarrhea, constipation, hematochezia, or melena  GU: Positive for urinary frequency.  Denies dysuria, hematuria, hesitancy, or incontinence  Musculoskeletal: Denies muscle/joint pain or swelling  Neurological: Denies headache or dizziness    Past Medical History  Current Outpatient Medications   Medication Sig   . medroxyPROGESTERone (DEPO-PROVERA) 150 mg/mL IntraMUSCULAR Suspension INJECT 1 ML (150 MG TOTAL) INTO THE MUSCLE EVERY 3 (THREE) MONTHS   . valACYclovir (VALTREX) 500 mg Oral Tablet TAKE 1 TABLET (500 MG TOTAL) BY MOUTH DAILY.     No Known Allergies  Past Medical History:   Diagnosis Date   . Hx of seasonal allergies      Family Medical History:    None       Social History     Socioeconomic History   . Marital status: Single   Tobacco Use   . Smoking status: Never Smoker   . Smokeless tobacco: Never Used   Substance and Sexual Activity   . Alcohol use: Yes     Comment: rarely   . Drug use: No       OBJECTIVE:  BP 126/70   Pulse 76   Temp 36.9 C (98.4 F) (Tympanic)   Ht 1.651 m (5\' 5" )   Wt 109 kg (240 lb)   SpO2 99%   BMI 39.94 kg/m     Physical Exam:    General: alert and oriented x 3, no apparent acute distress,  pleasant.  Cardiovascular: Normal rate and regular rhythm  Lungs: Clear to auscultation bilaterally, good air movement,  no wheezes, crackle, or rhonchi, no cough during exam    Abdomen: Soft, BS present x 4 quadrants, non-tender, no rebound, guarding  MS: No CVA tenderness or spinous process.   Neurologic: Alert and oriented x 3, normal gait, speech clear    Urine Dip Results:   Glucose (Ref Range: Negative mg/dL): Negative  Bilirubin (Ref Range: Negative mg/dL): Negative  Ketones (Ref Range: Negative mg/dL): Negative  Urine Specific Gravity (Ref Range: 1.005 - 1.030): 1.010  Blood (urine) (Ref Range: Negative mg/dL): (!) Trace Hemolyzed  pH (Ref Range: 5.0 - 8.0): 6.0  Protein (Ref Range: Negative mg/dL): Negative  Urobilinogen (Ref Range: Negative mg/dL): 0.2mg /dL (Normal)  Nitrite (Ref Range: Negative): Negative  Leukocytes (Ref Range: Negative WBC's/uL): Trace    ASSESSMENT and PLAN:    ICD-10-CM    1. Frequent urination  R35.0 POCT URINE DIPSTICK     URINE CULTURE     CANCELED: URINE CULTURE     A urine dipstick was performed.  Urine culture sent to lab for sensitivity.  Patient preferred to wait for culture before treating.    Follow up with your primary care provider if these symptoms worsen or fail  to improve as anticipated. Patient/Parent verbalized understanding and agrees with the plan of care. All questions were addressed at the time of the visit.     Jonna Coup, DNP,FNP-BC  06/11/2021, 09:11    Collaborating Physician: Lester Kinsman, MD    Portions of this note may be dictated using voice recognition software.   Variances in spelling and vocabulary are possible and unintentional. Not all errors are caught/corrected. Please notify the [facility] if any discrepancies are noted or if the meaning of any statement is not clear.

## 2021-06-11 NOTE — Nursing Note (Signed)
06/11/21 0918   Urine test  (Siemens Multistix 10 SG)   Color (Ref Range: Yellow) (!) Light  (Yellow)   Clarity (Ref Range: Clear) Clear   Glucose (Ref Range: Negative mg/dL) Negative   Bilirubin (Ref Range: Negative mg/dL) Negative   Ketones (Ref Range: Negative mg/dL) Negative   Urine Specific Gravity (Ref Range: 1.005 - 1.030) 1.010   Blood (urine) (Ref Range: Negative mg/dL) (!) Trace Hemolyzed   pH (Ref Range: 5.0 - 8.0) 6.0   Protein (Ref Range: Negative mg/dL) Negative   Urobilinogen (Ref Range: Negative mg/dL) 0.2mg /dL (Normal)   Nitrite (Ref Range: Negative) Negative   Leukocytes (Ref Range: Negative WBC's/uL) Trace   Performed Status Automated   Bottle Number   (Siemens Multistix 10 SG) [id number]   Lot # [id number]   Expiration Date 05/14/22   Initials J.Samadhi Mahurin

## 2021-06-11 NOTE — Nursing Note (Signed)
BP 126/70   Pulse 76   Temp 36.9 C (98.4 F) (Tympanic)   Ht 1.651 m (5\' 5" )   Wt 109 kg (240 lb)   SpO2 99%   BMI 39.94 kg/m     , CMA  06/11/2021, 09:03

## 2021-06-11 NOTE — Patient Instructions (Signed)
[facility], [place]  [place]  [address] [address]  Phone: [phone]  Fax: [phone]           Open Daily 8:00am - 8:00pm, except Sundays 12pm-8pm         ~ Closed Thanksgiving and Christmas Day     Attending Caregiver: Larkin Ina, DNP,FNP-BC    Today's orders:   Orders Placed This Encounter   . URINE CULTURE   . POCT URINE DIPSTICK        Prescription(s) E-Rx to:  [facility] [place], [address]    ________________________________________________________________________  Short Term Disability and [facility] does NOT provide assistance with any disability applications.  If you feel your medical condition requires you to be on disability, you will need to follow up with  Your primary care physician or a specialist.  We apologize for any inconvenience.    For Medication Prescribed by [facility]:  As an Urgent Care facility, our clinic does NOT offer prescription refills over the telephone.    If you need more of the medication one of our medical providers prescribed, you will  Either need to be re-evaluated by [place] or see your primary care physician.    ________________________________________________________________________      It is very important that we have a phone number that is the single best way to contact you in the event that we become aware of important clinical information or concerns after your discharge.  If the phone number you provided at registration is NOT this number you should inform staff and registration prior to leaving.      Your treatment and evaluation today was focused on identifying and treating potentially emergent conditions based on your presenting signs, symptoms, and history.  The resulting initial clinical impression and treatment plan is not intended to be definitive or a substitute for a full physical examination and evaluation by your primary care provider.  If your symptoms persist,  worsen, or you develop any new or concerning symptoms, you need to be evaluated.      If you received x-rays during your visit, be aware that the final and formal interpretation of those films by a radiologist may occur after your discharge.  If there is a significant discrepancy identified after your discharge, we will contact you at the telephone number provided at registration.      If you received a pelvic exam, you may have cultures pending for sexually transmitted diseases.  Positive cultures are reported to the [place] Department of Health as required by state law.  You should be contacted if you cultures are positive.  We will not contact you if they are negative.  You did NOT receive a PAP smear (the screening test for cervical).  This specific test for women is best performed by your gynecologist or primary care provider when indicated.      If you are over 46 year old, we cannot discuss your personal health information with a parent, spouse, family member, or anyone else without your express consent.  This does not include those who have legitimate access to your records and information to assist in your care under the provisions of HIPAA ([place] and [place] law, or those to whom you have previously given express written consent to do so, such a legal guardian or Power of [place].      You may have received medication that  may cause you to feel drowsy and/or light headed for several hours.  You may even experience some amnesia of your stay.  You should avoid operating a motor vehicle or performing any activity requiring complete alertness or coordination until you feel fully awake (approximately 24-48 hours).  Avoid alcoholic beverages.  You may also have a dry mouth for several hours.  This is a normal side effect and will disappear as the effects of the medication wear off.      Instructions discussed with patient upon discharge by clinical staff with all questions  answered.  Please call [facility] [phone] if any further questions.  Go immediately to the emergency department if any concern or worsening symptoms.    Jonna Coup, DNP,FNP-BC 06/11/2021, 09:16

## 2021-06-12 LAB — URINE CULTURE: URINE CULTURE: 50000 — AB

## 2021-06-12 NOTE — Result Encounter Note (Signed)
Final urine culture grew mixed Flora and suggests probable contaminants, suggest recollection.  This means that no specific bacteria grew out in the culture and contamination occurs when the bacteria on your skin comes in contact with your urine sample as your urine passes into the cup. Recommend to have a repeat urine culture if symptoms are not improving.    Jonna Coup, DNP,FNP-BC  06/12/2021, 12:19

## 2021-06-13 NOTE — Addendum Note (Signed)
Addended by: Corinda Gubler ENA on: 06/13/2021 11:24 AM     Modules accepted: Orders

## 2021-06-14 LAB — URINE CULTURE: URINE CULTURE: 40000 — AB

## 2021-06-14 NOTE — Result Encounter Note (Signed)
Final urine culture with no significant UTI. Mixed flora suggests probable contamination during the urine specimen collection process. Follow up with PCP or return to Urgent Care if symptoms do not improve as anticipated.    Buck Mam, NP  06/14/2021, 15:27

## 2021-09-09 ENCOUNTER — Ambulatory Visit: Payer: Commercial Managed Care - POS | Admitting: Nurse Practitioner

## 2021-09-09 VITALS — BP 108/84 | HR 80 | Temp 98.6°F | Resp 16 | Wt 240.0 lb

## 2021-09-09 DIAGNOSIS — R509 Fever, unspecified: Secondary | ICD-10-CM

## 2021-09-09 DIAGNOSIS — J209 Acute bronchitis, unspecified: Secondary | ICD-10-CM

## 2021-09-09 LAB — VH AMB POCT INFLUENZA A/B
Rapid Influenza A Ag POCT: NEGATIVE
Rapid Influenza B AG POCT: NEGATIVE

## 2021-09-09 LAB — VH AMB POCT SOFIA (TM)SARS CORONAVIRUS ANTIGEN FIA: Sofia SARS-CoV-2 Ag POCT: NEGATIVE

## 2021-09-09 MED ORDER — PREDNISONE 20 MG PO TABS
ORAL_TABLET | ORAL | 0 refills | Status: DC
Start: ? — End: 2021-09-09

## 2021-09-09 MED ORDER — ALBUTEROL SULFATE HFA 108 (90 BASE) MCG/ACT IN AERS
2.0000 | INHALATION_SPRAY | RESPIRATORY_TRACT | 0 refills | Status: DC | PRN
Start: ? — End: 2021-09-09

## 2021-09-09 MED ORDER — AZITHROMYCIN 250 MG PO TABS
ORAL_TABLET | ORAL | 0 refills | Status: DC
Start: ? — End: 2021-09-09

## 2021-09-09 MED ORDER — BENZONATATE 100 MG PO CAPS
100.0000 mg | ORAL_CAPSULE | Freq: Three times a day (TID) | ORAL | 0 refills | Status: DC | PRN
Start: ? — End: 2021-09-09

## 2021-09-09 NOTE — Progress Notes (Signed)
Subjective:    Patient ID: Heidi Carr is a 25 y.o. female.  This is an established patient of Verda Cumins, MD.  Liliane Channel has a PMH history of:  History reviewed. No pertinent past medical history.  Breon Neyra Pettie has a past surgical history of:  History reviewed. No pertinent surgical history.  Ileigh Wayland Denis medications include:  Current Outpatient Medications on File Prior to Visit   Medication Sig Dispense Refill    fexofenadine-pseudoephedrine (ALLEGRA-D 24) 180-240 MG per 24 hr tablet Take 1 tablet by mouth daily      fluticasone (FLONASE) 50 MCG/ACT nasal spray by Each Nare route as needed.      medroxyPROGESTERone (DEPO-PROVERA) 150 MG/ML injection Inject 150 mg into the muscle every 3 (three) months      valACYclovir HCL (VALTREX) 500 MG tablet Take 500 mg by mouth daily      vitamin D, ergocalciferol, (DRISDOL) 50000 UNIT Cap Take 50,000 Units by mouth once a week       No current facility-administered medications on file prior to visit.     Dalyce Pura Picinich is allergic to:  No Known Allergies  Pertinent positives and negatives were reviewed and updated as applicable.  Note: This chart was generated by the Epic EMR system/ speech recognition and may contain inherent errors, including typographical, or omissions not intended by the user   Shannyn Wayland Denis chief complaint today is:   Chief Complaint   Patient presents with    Fever     Fever, nasal and chest congestion, body aches, dizziness, left ear pressure, sinus pressure, loss of appetite - onset yesterday     HPI    This is a 25 year old female patient who presents to the clinic today with a chief complaint of fatigue, cough, body aches, chest congestion, ear pressure and some dizziness onset yesterday.  Heidi Carr has not had a fever.  Heidi Carr has not had any abdominal pain, nausea vomiting or diarrhea.  Heidi Carr is eating and drinking appropriately.  Heidi Carr has taken over-the-counter medications for symptoms.  No  other complaints at this time.    Review of Systems    As per HPI  Objective:    BP 108/84   Pulse 80   Temp 98.6 F (37 C) (Tympanic)   Resp 16   Wt 108.9 kg (240 lb)   LMP 09/06/2021     Physical Exam  Vitals and nursing note reviewed.   Constitutional:       General: Heidi Carr is not in acute distress.     Appearance: Normal appearance. Heidi Carr is well-developed. Heidi Carr is not diaphoretic.   HENT:      Head: Normocephalic and atraumatic.      Right Ear: Tympanic membrane and external ear normal.      Left Ear: Tympanic membrane and external ear normal.      Mouth/Throat:      Mouth: Mucous membranes are moist.      Pharynx: Posterior oropharyngeal erythema present.      Comments: There is posterior oropharyngeal erythema without edema or exudate  Eyes:      Pupils: Pupils are equal, round, and reactive to light.   Neck:      Thyroid: No thyromegaly.      Vascular: No JVD.      Trachea: No tracheal deviation.   Cardiovascular:      Rate and Rhythm: Normal rate and regular rhythm.      Heart sounds: Normal heart  sounds. No murmur heard.    No friction rub. No gallop.   Pulmonary:      Effort: Pulmonary effort is normal.      Comments: Diminished throughout  Abdominal:      General: There is no distension.      Palpations: Abdomen is soft.      Tenderness: There is no abdominal tenderness.   Musculoskeletal:         General: Normal range of motion.      Cervical back: Normal range of motion and neck supple.   Skin:     General: Skin is warm and dry.      Capillary Refill: Capillary refill takes less than 2 seconds.   Neurological:      Mental Status: Heidi Carr is alert and oriented to person, place, and time.         Assessment and Plan:     Lab Results from today's visit:  Recent Results (from the past 12 hour(s))   [facility] SARS Coronavirus Antigen [facility] POCT    Collection Time: 09/09/21 11:20 AM   Result Value Ref Range    [facility] SARS-CoV-2 Ag POCT Negative Negative   [facility] Influenza A/B POCT    Collection Time: 09/09/21 11:20 AM    Result Value Ref Range    POCT QC Pass     Rapid Influenza A Ag POCT Negative Negative    Rapid Influenza B AG POCT Negative Negative       Radiology Results from today's visit:  No results found.    1. Acute bronchitis, unspecified organism  azithromycin (ZITHROMAX) 250 MG tablet    albuterol sulfate HFA (PROVENTIL) 108 (90 Base) MCG/ACT inhaler    benzonatate (Tessalon Perles) 100 MG capsule    predniSONE (DELTASONE) 20 MG tablet      2. Fever, unspecified fever cause  [facility] SARS Coronavirus Antigen [facility] POCT    [facility] Influenza A/B POCT    CANCELED: [facility] 2 Flu + SARS Antigen [facility] POCT        Patient's clinical findings consistent with acute bronchitis.  Albuterol every 4-6 hours as needed for cough, shortness of breath, or wheezing.  -Steroid burst inflammation.   -Tessalon TID PRN and Delsym or Mucinex PRN cough.  -Take Tylenol or Ibuprofen every 4-6 hours as needed for pain or fever.  -Drink lots of water, monitor PO intake and urine output for hydration.  -Flonase will help with inflammation of the nose and post nasal drip.  -Use saline spray in the nose, or a humidifier to loosen discharge.  -Zyrtec or Claritin for allergy symptoms.  Follow-up with PCP as needed.  To ER sooner for any new or worsening symptoms, new complaints or failure to improve.  Patient is aware of and agreeable with this plan of care and will follow up as indicated.            Raynelle Dick, NP  [facility]  09/09/2021  11:41 AM
